# Patient Record
Sex: Female | Born: 1995 | Race: White | Hispanic: No | Marital: Married | Smoking: Former smoker
Health system: Southern US, Community
[De-identification: ages and names within clinical notes are randomized; demographics above are authoritative.]

## PROBLEM LIST (undated history)

## (undated) ENCOUNTER — Emergency Department (HOSPITAL_COMMUNITY): Admission: EM | Source: Home / Self Care

## (undated) ENCOUNTER — Inpatient Hospital Stay (HOSPITAL_COMMUNITY): Payer: Self-pay

## (undated) DIAGNOSIS — N200 Calculus of kidney: Secondary | ICD-10-CM

## (undated) DIAGNOSIS — R011 Cardiac murmur, unspecified: Secondary | ICD-10-CM

## (undated) DIAGNOSIS — I370 Nonrheumatic pulmonary valve stenosis: Secondary | ICD-10-CM

## (undated) DIAGNOSIS — K219 Gastro-esophageal reflux disease without esophagitis: Secondary | ICD-10-CM

## (undated) DIAGNOSIS — R1110 Vomiting, unspecified: Secondary | ICD-10-CM

## (undated) HISTORY — PX: WISDOM TOOTH EXTRACTION: SHX21

## (undated) HISTORY — PX: NO PAST SURGERIES: SHX2092

---
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
## 2016-01-18 ENCOUNTER — Emergency Department (HOSPITAL_COMMUNITY)
Admission: EM | Admit: 2016-01-18 | Discharge: 2016-01-18 | Disposition: A | Discharge status: Home / Self Care | Payer: Medicaid Other | Attending: Physician Assistant | Admitting: Physician Assistant

## 2016-01-18 ENCOUNTER — Encounter (HOSPITAL_COMMUNITY): Payer: Self-pay | Admitting: Emergency Medicine

## 2016-01-18 ENCOUNTER — Emergency Department (HOSPITAL_COMMUNITY): Payer: Medicaid Other

## 2016-01-18 DIAGNOSIS — R930 Abnormal findings on diagnostic imaging of skull and head, not elsewhere classified: Secondary | ICD-10-CM | POA: Insufficient documentation

## 2016-01-18 DIAGNOSIS — R569 Unspecified convulsions: Secondary | ICD-10-CM

## 2016-01-18 DIAGNOSIS — Z79899 Other long term (current) drug therapy: Secondary | ICD-10-CM | POA: Diagnosis not present

## 2016-01-18 DIAGNOSIS — R5383 Other fatigue: Secondary | ICD-10-CM | POA: Insufficient documentation

## 2016-01-18 DIAGNOSIS — R112 Nausea with vomiting, unspecified: Secondary | ICD-10-CM | POA: Diagnosis present

## 2016-01-18 LAB — I-STAT BETA HCG BLOOD, ED (MC, WL, AP ONLY)

## 2016-01-18 LAB — BASIC METABOLIC PANEL
ANION GAP: 11 (ref 5–15)
BUN: 12 mg/dL (ref 6–20)
CALCIUM: 9.3 mg/dL (ref 8.9–10.3)
CO2: 19 mmol/L — AB (ref 22–32)
CREATININE: 0.57 mg/dL (ref 0.44–1.00)
Chloride: 108 mmol/L (ref 101–111)
GLUCOSE: 125 mg/dL — AB (ref 65–99)
Potassium: 3.1 mmol/L — ABNORMAL LOW (ref 3.5–5.1)
Sodium: 138 mmol/L (ref 135–145)

## 2016-01-18 LAB — ETHANOL: Alcohol, Ethyl (B): <5 mg/dL (ref <5)

## 2016-01-18 LAB — CBC
HCT: 42 % (ref 36.0–46.0)
HEMOGLOBIN: 14.8 g/dL (ref 12.0–15.0)
MCH: 32.1 pg (ref 26.0–34.0)
MCHC: 35.2 g/dL (ref 30.0–36.0)
MCV: 91.1 fL (ref 78.0–100.0)
PLATELETS: 216 10*3/uL (ref 150–400)
RBC: 4.61 MIL/uL (ref 3.87–5.11)
RDW: 12.1 % (ref 11.5–15.5)
WBC: 13.3 10*3/uL — ABNORMAL HIGH (ref 4.0–10.5)

## 2016-01-18 LAB — CBG MONITORING, ED: GLUCOSE-CAPILLARY: 135 mg/dL — AB (ref 65–99)

## 2016-01-18 LAB — LIPASE, BLOOD: Lipase: 23 U/L (ref 11–51)

## 2016-01-18 MED ORDER — SODIUM CHLORIDE 0.9 % IV BOLUS (SEPSIS)
1000.0000 mL | Freq: Once | INTRAVENOUS | Status: AC
  Administered 2016-01-18: 1000 mL via INTRAVENOUS

## 2016-01-18 MED ORDER — DIPHENHYDRAMINE HCL 50 MG/ML IJ SOLN
25.0000 mg | Freq: Once | INTRAMUSCULAR | Status: AC
  Administered 2016-01-18: 25 mg via INTRAVENOUS
  Filled 2016-01-18: qty 1

## 2016-01-18 MED ORDER — PROCHLORPERAZINE EDISYLATE 5 MG/ML IJ SOLN
10.0000 mg | Freq: Once | INTRAMUSCULAR | Status: AC
  Administered 2016-01-18: 10 mg via INTRAVENOUS
  Filled 2016-01-18: qty 2

## 2016-01-18 NOTE — ED Notes (Signed)
Pt has been reminded about a urine sample but is still unable to obtain one at this time

## 2016-01-18 NOTE — ED Notes (Signed)
Pt is aware that a urine sample is needed but is unable to provide one at this time 

## 2016-01-18 NOTE — Discharge Instructions (Signed)
We are unsure what is causing your problem this morning. It may have been a seizure. We will need to refrain from driving or bathing a small child until you get followed up with a primary care physician and her neurologist.

## 2016-01-18 NOTE — ED Triage Notes (Addendum)
Presents via EMS, pt with possible seizure activity. States she has been under a lot of stress. Family witnessed pt slide down the wall and began shaking. Pt has hx of anxiety and takes no seizure meds. Pt given 4mg  zofran PTA due to dry heaving.

## 2016-01-18 NOTE — ED Notes (Signed)
Pt ambulates in the hall and goes to the bathroom.  Refuses to eat anything, including a saltine cracker

## 2016-01-18 NOTE — ED Notes (Signed)
Pt reports N/V since 6 this morning. Pt states she remembers falling in the hallway at home but doesn't know why. No incontinence or oral trauma noted.

## 2016-01-18 NOTE — ED Notes (Signed)
Pt can ambulate with no assistance

## 2016-01-18 NOTE — ED Notes (Signed)
RN is pulling blood off pts IV

## 2016-01-18 NOTE — ED Provider Notes (Signed)
[facility]-EMERGENCY DEPT Provider Note   CSN: [id number] Arrival date & time: 01/18/16  [id number]     History   Chief Complaint Chief Complaint  Patient presents with  . Seizures    HPI Mary Ware is a 20 y.o. female.  HPI   Patient is a 20 year old female presenting with nausea and vomiting. Patient has been seen multiple times here for the past for the same thing. Patient does not want to talk to this M.D. right now. She reports "I just need to sleep". Patient is very fatigued here. I tried to explore why it is that she has not been sleeping. . She denies any drug use. Denies pregnancy.  Patient denies any pain. She reports "nothing wrong with my stomach, I just have to sleep."  Past Medical History:  Diagnosis Date  . GERD (gastroesophageal reflux disease)   . Heart murmur    h/o r/t pulmonary valve stenosis  . Intractable vomiting   . Kidney stones   . Pulmonary valve stenosis    AS A CHILD-PT HAS OUTGROWN AND REVERSED ITSELF PER PT AND DOES NOT SEE A CARDIOLOGIST-ASYMPTOMATIC    Patient Active Problem List   Diagnosis Date Noted  . Acute pancreatitis 06/02/2015  . Nausea & vomiting 06/02/2015  . Pancreatitis 06/02/2015  . Pancreatitis, acute 06/02/2015  . Infectious gastroenteritis 06/02/2015  . Cholecystitis 04/27/2015  . Active labor 07/26/2013  . Status post vacuum-assisted vaginal delivery 07/26/2013    Past Surgical History:  Procedure Laterality Date  . CHOLECYSTECTOMY N/A 04/27/2015   Procedure: LAPAROSCOPIC CHOLECYSTECTOMY WITH INTRAOPERATIVE CHOLANGIOGRAM;  Surgeon: Nadeen LandauJarvis Wilton Smith, MD;  Location: [facility] ORS;  Service: General;  Laterality: N/A;  . NO PAST SURGERIES      OB History    Gravida Para Term Preterm AB Living   1 1 1     1    SAB TAB Ectopic Multiple Live Births           1       Home Medications    Prior to Admission medications   Medication Sig Start Date End Date Taking? Authorizing Provider  etonogestrel (NEXPLANON) 68 MG  IMPL implant 1 each by Subdermal route once. 09/2012    Historical Provider, MD  LORazepam (ATIVAN) 1 MG tablet Take 1 tablet (1 mg total) by mouth every 8 (eight) hours as needed for anxiety (Or nausea). 06/16/15 06/15/16  Minna AntisKevin Paduchowski, MD  ondansetron (ZOFRAN) 4 MG tablet Take 1 tablet (4 mg total) by mouth every 6 (six) hours as needed for nausea. 06/03/15   Belkys A Regalado, MD  promethazine (PHENERGAN) 25 MG suppository Place 1 suppository (25 mg total) rectally every 6 (six) hours as needed for nausea. 06/13/15 06/12/16  Phineas SemenGraydon Goodman, MD    Family History Family History  Problem Relation Age of Onset  . Cancer Mother     Social History Social History  Substance Use Topics  . Smoking status: Never Smoker  . Smokeless tobacco: Never Used  . Alcohol use No     Allergies   Coconut fatty acids   Review of Systems Review of Systems  Constitutional: Positive for fatigue. Negative for fever.  Gastrointestinal: Positive for nausea and vomiting. Negative for abdominal pain and diarrhea.  Neurological: Positive for weakness. Negative for dizziness.  All other systems reviewed and are negative.    Physical Exam Updated Vital Signs BP 122/60 (BP Location: Right Arm)   Pulse (!) 54   Temp 97.9 F (36.6 C) (Oral)  Resp 15   Ht 5' (1.524 m)   Wt 100 lb (45.4 kg)   LMP 01/01/2016   SpO2 99%   BMI 19.53 kg/m   Physical Exam  Constitutional: She is oriented to person, place, and time. She appears well-developed and well-nourished.  HENT:  Head: Normocephalic and atraumatic.  Eyes: Right eye exhibits no discharge.  Cardiovascular: Normal rate, regular rhythm and normal heart sounds.   No murmur heard. Pulmonary/Chest: Effort normal and breath sounds normal. She has no wheezes. She has no rales.  Abdominal: Soft. She exhibits no distension and no mass. There is no tenderness. There is no rebound and no guarding.  Neurological: She is oriented to person, place, and time. No  cranial nerve deficit.  Appears very fatigued  Skin: Skin is warm and dry. She is not diaphoretic.  Psychiatric: She has a normal mood and affect.  Patient refusing to communicate right now. Stating " I just need to sleep".  Nursing note and vitals reviewed.    ED Treatments / Results  Labs (all labs ordered are listed, but only abnormal results are displayed) Labs Reviewed  BASIC METABOLIC PANEL - Abnormal; Notable for the following:       Result Value   Potassium 3.1 (*)    CO2 19 (*)    Glucose, Bld 125 (*)    All other components within normal limits  CBC - Abnormal; Notable for the following:    WBC 13.3 (*)    All other components within normal limits  CBG MONITORING, ED - Abnormal; Notable for the following:    Glucose-Capillary 135 (*)    All other components within normal limits  RAPID URINE DRUG SCREEN, HOSP PERFORMED  ETHANOL  LIPASE, BLOOD  I-STAT BETA HCG BLOOD, ED ([facility], [facility], [facility] ONLY)    EKG  EKG Interpretation None       Radiology No results found.  Procedures Procedures (including critical care time)  Medications Ordered in ED Medications  sodium chloride 0.9 % bolus 1,000 mL (not administered)  prochlorperazine (COMPAZINE) injection 10 mg (not administered)  diphenhydrAMINE (BENADRYL) injection 25 mg (not administered)  sodium chloride 0.9 % bolus 1,000 mL (not administered)     Initial Impression / Assessment and Plan / ED Course  I have reviewed the triage vital signs and the nursing notes.  Pertinent labs & imaging results that were available during my care of the patient were reviewed by me and considered in my medical decision making (see chart for details).  Clinical Course     Patient is a 20 year old female presenting with unclear history. Patient's been seen multiple times the emergency department for nausea and vomiting. However today she had some kind of event that was witnessed by family. Family not present here/ Patient denies  any pain or issues right now she states " I just want to sleep" . However I'm concerned could've had a brain injury will rule out with the CT brain. We'll make sure the patient's labs are normal. Will check for drugs of abuse. Unsure what is driving patient's fatigue and nausea or vomiting. Will help her combat both the fatigue by helping her sleep with an antinausea medication that'll help her sleep.  4:03 PM Patient ambulatory, taking PO. I offered to talk to her parents, she refuses.   Warned patient not to drive, to follow up with PCP this week.    Final Clinical Impressions(s) / ED Diagnoses   Final diagnoses:  None    New  Prescriptions New Prescriptions   No medications on file     Wylder Macomber Randall An, MD 01/18/16 [phone]

## 2016-01-18 NOTE — ED Notes (Signed)
Pt is drinking sprite. Offered pt every item of food we have and she refuses.  Brought her graham crackers anyway and asked her to eat them.  Pt states "I cant eat right now, I will have my mom get me some food"

## 2016-01-18 NOTE — ED Notes (Signed)
Bed: [id number] Expected date: 01/18/16 Expected time:  Means of arrival:  Comments: Ems 20 yo seizure

## 2016-07-10 ENCOUNTER — Emergency Department (HOSPITAL_COMMUNITY)
Admission: EM | Admit: 2016-07-10 | Discharge: 2016-07-10 | Disposition: A | Discharge status: Home / Self Care | Payer: Medicaid Other | Attending: Emergency Medicine | Admitting: Emergency Medicine

## 2016-07-10 ENCOUNTER — Encounter (HOSPITAL_COMMUNITY): Payer: Self-pay | Admitting: Adult Health

## 2016-07-10 DIAGNOSIS — R443 Hallucinations, unspecified: Secondary | ICD-10-CM | POA: Insufficient documentation

## 2016-07-10 DIAGNOSIS — F410 Panic disorder [episodic paroxysmal anxiety] without agoraphobia: Secondary | ICD-10-CM

## 2016-07-10 NOTE — ED Notes (Signed)
Pt left without paperwork. She was pacing room and anxious to leave. She stated she had a ride with her husband who was coming to get her.
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
## 2016-07-10 NOTE — ED Notes (Signed)
PT denies hallucinations and states her sister made her come. She is requesting to leave.
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
## 2016-07-10 NOTE — Discharge Instructions (Signed)
Return here, if you need additional help or have other concerns.  Consider seeing a counselor or therapist, for help if you are nervous or uncomfortable with things.

## 2016-07-12 ENCOUNTER — Encounter (HOSPITAL_COMMUNITY): Payer: Self-pay | Admitting: Emergency Medicine

## 2016-07-12 ENCOUNTER — Inpatient Hospital Stay (HOSPITAL_COMMUNITY)
Admission: EM | Admit: 2016-07-12 | Discharge: 2016-07-13 | DRG: 885 | Disposition: A | Discharge status: Home / Self Care | Payer: Medicaid Other | Attending: Internal Medicine | Admitting: Internal Medicine

## 2016-07-12 DIAGNOSIS — F4310 Post-traumatic stress disorder, unspecified: Secondary | ICD-10-CM | POA: Diagnosis not present

## 2016-07-12 DIAGNOSIS — E876 Hypokalemia: Secondary | ICD-10-CM

## 2016-07-12 DIAGNOSIS — F13931 Sedative, hypnotic or anxiolytic use, unspecified with withdrawal delirium: Secondary | ICD-10-CM

## 2016-07-12 DIAGNOSIS — Z62810 Personal history of physical and sexual abuse in childhood: Secondary | ICD-10-CM | POA: Diagnosis not present

## 2016-07-12 DIAGNOSIS — Z91048 Other nonmedicinal substance allergy status: Secondary | ICD-10-CM | POA: Diagnosis not present

## 2016-07-12 DIAGNOSIS — F13239 Sedative, hypnotic or anxiolytic dependence with withdrawal, unspecified: Secondary | ICD-10-CM | POA: Diagnosis present

## 2016-07-12 DIAGNOSIS — F1390 Sedative, hypnotic, or anxiolytic use, unspecified, uncomplicated: Secondary | ICD-10-CM | POA: Diagnosis not present

## 2016-07-12 DIAGNOSIS — Z91018 Allergy to other foods: Secondary | ICD-10-CM | POA: Diagnosis not present

## 2016-07-12 DIAGNOSIS — R443 Hallucinations, unspecified: Secondary | ICD-10-CM | POA: Diagnosis not present

## 2016-07-12 DIAGNOSIS — Z818 Family history of other mental and behavioral disorders: Secondary | ICD-10-CM

## 2016-07-12 DIAGNOSIS — R112 Nausea with vomiting, unspecified: Secondary | ICD-10-CM | POA: Diagnosis not present

## 2016-07-12 DIAGNOSIS — Z9049 Acquired absence of other specified parts of digestive tract: Secondary | ICD-10-CM | POA: Diagnosis not present

## 2016-07-12 DIAGNOSIS — F29 Unspecified psychosis not due to a substance or known physiological condition: Secondary | ICD-10-CM | POA: Diagnosis not present

## 2016-07-12 DIAGNOSIS — F17210 Nicotine dependence, cigarettes, uncomplicated: Secondary | ICD-10-CM | POA: Diagnosis present

## 2016-07-12 DIAGNOSIS — Z793 Long term (current) use of hormonal contraceptives: Secondary | ICD-10-CM | POA: Diagnosis not present

## 2016-07-12 DIAGNOSIS — F22 Delusional disorders: Principal | ICD-10-CM | POA: Diagnosis present

## 2016-07-12 DIAGNOSIS — Z79899 Other long term (current) drug therapy: Secondary | ICD-10-CM | POA: Diagnosis not present

## 2016-07-12 DIAGNOSIS — F13231 Sedative, hypnotic or anxiolytic dependence with withdrawal delirium: Secondary | ICD-10-CM

## 2016-07-12 DIAGNOSIS — F1190 Opioid use, unspecified, uncomplicated: Secondary | ICD-10-CM | POA: Diagnosis not present

## 2016-07-12 DIAGNOSIS — Z8659 Personal history of other mental and behavioral disorders: Secondary | ICD-10-CM | POA: Diagnosis present

## 2016-07-12 DIAGNOSIS — R441 Visual hallucinations: Secondary | ICD-10-CM

## 2016-07-12 LAB — ACETAMINOPHEN LEVEL

## 2016-07-12 LAB — CBC WITH DIFFERENTIAL/PLATELET
Basophils Absolute: 0 10*3/uL (ref 0.0–0.1)
Basophils Relative: 0 %
Eosinophils Absolute: 0.1 10*3/uL (ref 0.0–0.7)
Eosinophils Relative: 1 %
HEMATOCRIT: 44.5 % (ref 36.0–46.0)
Hemoglobin: 15.6 g/dL — ABNORMAL HIGH (ref 12.0–15.0)
Lymphocytes Relative: 18 %
Lymphs Abs: 1.8 10*3/uL (ref 0.7–4.0)
MCH: 32 pg (ref 26.0–34.0)
MCHC: 35.1 g/dL (ref 30.0–36.0)
MCV: 91.2 fL (ref 78.0–100.0)
MONO ABS: 1.1 10*3/uL — AB (ref 0.1–1.0)
Monocytes Relative: 11 %
NEUTROS ABS: 7.1 10*3/uL (ref 1.7–7.7)
Neutrophils Relative %: 70 %
Platelets: 216 10*3/uL (ref 150–400)
RBC: 4.88 MIL/uL (ref 3.87–5.11)
RDW: 11.9 % (ref 11.5–15.5)
WBC: 10.2 10*3/uL (ref 4.0–10.5)

## 2016-07-12 LAB — URINALYSIS, ROUTINE W REFLEX MICROSCOPIC
BILIRUBIN URINE: NEGATIVE
Glucose, UA: NEGATIVE mg/dL
KETONES UR: 20 mg/dL — AB
LEUKOCYTES UA: NEGATIVE
NITRITE: NEGATIVE
PROTEIN: 30 mg/dL — AB
Specific Gravity, Urine: 1.027 (ref 1.005–1.030)
pH: 5 (ref 5.0–8.0)

## 2016-07-12 LAB — COMPREHENSIVE METABOLIC PANEL
ALBUMIN: 4.5 g/dL (ref 3.5–5.0)
ALT: 14 U/L (ref 14–54)
ANION GAP: 13 (ref 5–15)
AST: 27 U/L (ref 15–41)
Alkaline Phosphatase: 44 U/L (ref 38–126)
BILIRUBIN TOTAL: 1.8 mg/dL — AB (ref 0.3–1.2)
BUN: 13 mg/dL (ref 6–20)
CO2: 20 mmol/L — ABNORMAL LOW (ref 22–32)
Calcium: 9.2 mg/dL (ref 8.9–10.3)
Chloride: 105 mmol/L (ref 101–111)
Creatinine, Ser: 0.82 mg/dL (ref 0.44–1.00)
GFR calc Af Amer: >60 mL/min (ref >60)
GFR calc non Af Amer: >60 mL/min (ref >60)
GLUCOSE: 110 mg/dL — AB (ref 65–99)
POTASSIUM: 2.8 mmol/L — AB (ref 3.5–5.1)
Sodium: 138 mmol/L (ref 135–145)
TOTAL PROTEIN: 6.8 g/dL (ref 6.5–8.1)

## 2016-07-12 LAB — SALICYLATE LEVEL

## 2016-07-12 LAB — LIPASE, BLOOD: Lipase: 18 U/L (ref 11–51)

## 2016-07-12 LAB — I-STAT BETA HCG BLOOD, ED (MC, WL, AP ONLY): I-stat hCG, quantitative: <5 m[IU]/mL (ref <5)

## 2016-07-12 LAB — RAPID URINE DRUG SCREEN, HOSP PERFORMED
Amphetamines: NOT DETECTED
Barbiturates: NOT DETECTED
Benzodiazepines: POSITIVE — AB
Cocaine: NOT DETECTED
OPIATES: NOT DETECTED
Tetrahydrocannabinol: POSITIVE — AB

## 2016-07-12 LAB — ETHANOL

## 2016-07-12 MED ORDER — ONDANSETRON HCL 4 MG/2ML IJ SOLN: 4.0000 mg | Freq: Four times a day (QID) | INTRAMUSCULAR | Status: DC | PRN

## 2016-07-12 MED ORDER — POTASSIUM CHLORIDE 10 MEQ/100ML IV SOLN
10.0000 meq | Freq: Once | INTRAVENOUS | Status: AC
  Administered 2016-07-12: 10 meq via INTRAVENOUS
  Filled 2016-07-12: qty 100

## 2016-07-12 MED ORDER — LORAZEPAM 2 MG/ML IJ SOLN
2.0000 mg | Freq: Once | INTRAMUSCULAR | Status: AC
  Administered 2016-07-12: 2 mg via INTRAMUSCULAR
  Filled 2016-07-12: qty 1

## 2016-07-12 MED ORDER — POLYETHYLENE GLYCOL 3350 17 G PO PACK: 17.0000 g | PACK | Freq: Every day | ORAL | Status: DC | PRN

## 2016-07-12 MED ORDER — ONDANSETRON HCL 4 MG PO TABS: 4.0000 mg | ORAL_TABLET | Freq: Four times a day (QID) | ORAL | Status: DC | PRN

## 2016-07-12 MED ORDER — SODIUM CHLORIDE 0.9 % IV BOLUS (SEPSIS)
1000.0000 mL | Freq: Once | INTRAVENOUS | Status: AC
  Administered 2016-07-12: 1000 mL via INTRAVENOUS

## 2016-07-12 MED ORDER — HALOPERIDOL LACTATE 5 MG/ML IJ SOLN
5.0000 mg | Freq: Once | INTRAMUSCULAR | Status: AC
  Administered 2016-07-12: 5 mg via INTRAMUSCULAR
  Filled 2016-07-12: qty 1

## 2016-07-12 MED ORDER — THIAMINE HCL 100 MG/ML IJ SOLN
Freq: Once | INTRAVENOUS | Status: AC
  Administered 2016-07-12: 11:00:00 via INTRAVENOUS
  Filled 2016-07-12: qty 1000

## 2016-07-12 MED ORDER — ENOXAPARIN SODIUM 40 MG/0.4ML ~~LOC~~ SOLN
40.0000 mg | SUBCUTANEOUS | Status: DC
  Filled 2016-07-12: qty 0.4

## 2016-07-12 MED ORDER — LORAZEPAM 2 MG/ML IJ SOLN
2.0000 mg | Freq: Once | INTRAMUSCULAR | Status: AC
Start: 2016-07-12 — End: 2016-07-12
  Administered 2016-07-12: 2 mg via INTRAMUSCULAR
  Filled 2016-07-12: qty 1

## 2016-07-12 MED ORDER — POTASSIUM CHLORIDE CRYS ER 20 MEQ PO TBCR
40.0000 meq | EXTENDED_RELEASE_TABLET | Freq: Once | ORAL | Status: DC
  Filled 2016-07-12: qty 2

## 2016-07-12 MED ORDER — ACETAMINOPHEN 650 MG RE SUPP: 650.0000 mg | Freq: Four times a day (QID) | RECTAL | Status: DC | PRN

## 2016-07-12 MED ORDER — ACETAMINOPHEN 325 MG PO TABS: 650.0000 mg | ORAL_TABLET | Freq: Four times a day (QID) | ORAL | Status: DC | PRN

## 2016-07-12 MED ORDER — ENSURE ENLIVE PO LIQD
237.0000 mL | Freq: Two times a day (BID) | ORAL | Status: DC
  Administered 2016-07-13: 237 mL via ORAL

## 2016-07-12 MED ORDER — POTASSIUM CHLORIDE 10 MEQ/100ML IV SOLN
10.0000 meq | INTRAVENOUS | Status: AC
  Administered 2016-07-12 (×6): 10 meq via INTRAVENOUS
  Filled 2016-07-12 (×6): qty 100

## 2016-07-12 NOTE — ED Notes (Addendum)
Admitting Providers at bedside. 

## 2016-07-12 NOTE — ED Notes (Signed)
Pt continues to be restless in room, moving from bed to floor and then to chair. Family concerned regarding pt safety. RN and MD at bedside, pt continues to be cooperative with medication administration from this RN. Pt resting in bed after medication administration.

## 2016-07-12 NOTE — ED Notes (Signed)
Pt restless in bed. Pt cooperative and calm with medication administration at this time.
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
## 2016-07-12 NOTE — ED Notes (Signed)
[employer] arrived with 3 copies of IVC paperwork for pt.
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
## 2016-07-12 NOTE — ED Notes (Signed)
Family member notes pt has no mental health dx but has had hallucinations for some time now. Possible misuse of xanax.
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
## 2016-07-13 DIAGNOSIS — F1390 Sedative, hypnotic, or anxiolytic use, unspecified, uncomplicated: Secondary | ICD-10-CM

## 2016-07-13 DIAGNOSIS — F1190 Opioid use, unspecified, uncomplicated: Secondary | ICD-10-CM

## 2016-07-13 DIAGNOSIS — R112 Nausea with vomiting, unspecified: Secondary | ICD-10-CM

## 2016-07-13 DIAGNOSIS — E876 Hypokalemia: Secondary | ICD-10-CM

## 2016-07-13 DIAGNOSIS — F4310 Post-traumatic stress disorder, unspecified: Secondary | ICD-10-CM

## 2016-07-13 DIAGNOSIS — R443 Hallucinations, unspecified: Secondary | ICD-10-CM

## 2016-07-13 DIAGNOSIS — F29 Unspecified psychosis not due to a substance or known physiological condition: Secondary | ICD-10-CM

## 2016-07-13 LAB — CBC
HEMATOCRIT: 38.7 % (ref 36.0–46.0)
Hemoglobin: 12.8 g/dL (ref 12.0–15.0)
MCH: 30.5 pg (ref 26.0–34.0)
MCHC: 33.1 g/dL (ref 30.0–36.0)
MCV: 92.4 fL (ref 78.0–100.0)
Platelets: 170 10*3/uL (ref 150–400)
RBC: 4.19 MIL/uL (ref 3.87–5.11)
RDW: 11.6 % (ref 11.5–15.5)
WBC: 7 10*3/uL (ref 4.0–10.5)

## 2016-07-13 LAB — BASIC METABOLIC PANEL
Anion gap: 6 (ref 5–15)
BUN: 8 mg/dL (ref 6–20)
CHLORIDE: 110 mmol/L (ref 101–111)
CO2: 21 mmol/L — AB (ref 22–32)
Calcium: 8.2 mg/dL — ABNORMAL LOW (ref 8.9–10.3)
Creatinine, Ser: 0.66 mg/dL (ref 0.44–1.00)
GFR calc Af Amer: >60 mL/min (ref >60)
GFR calc non Af Amer: >60 mL/min (ref >60)
Glucose, Bld: 76 mg/dL (ref 65–99)
POTASSIUM: 3.6 mmol/L (ref 3.5–5.1)
SODIUM: 137 mmol/L (ref 135–145)

## 2016-07-13 LAB — HIV ANTIBODY (ROUTINE TESTING W REFLEX): HIV Screen 4th Generation wRfx: NONREACTIVE

## 2016-07-13 MED ORDER — MIRTAZAPINE 15 MG PO TABS: 15.0000 mg | ORAL_TABLET | Freq: Every day | ORAL | 0 refills | Status: DC

## 2016-07-13 MED ORDER — POTASSIUM CHLORIDE CRYS ER 20 MEQ PO TBCR
40.0000 meq | EXTENDED_RELEASE_TABLET | Freq: Once | ORAL | Status: AC
  Administered 2016-07-13: 40 meq via ORAL
  Filled 2016-07-13: qty 2

## 2016-07-13 NOTE — Progress Notes (Signed)
Patient was discharged to home. AVS was reviewed with patient and a copy was given to patient as well, also with a prescription and work note. Patient agreed and verbalized understanding with no further questions at this moment. Patient left the unit via wheelchair accompanied by her sitter and spouse.  Mary Ware, [name] n 07/13/16 2:31 PM
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
## 2016-08-07 ENCOUNTER — Emergency Department (HOSPITAL_COMMUNITY): Payer: Medicaid Other

## 2016-08-07 ENCOUNTER — Encounter (HOSPITAL_COMMUNITY): Payer: Self-pay

## 2016-08-07 ENCOUNTER — Emergency Department (HOSPITAL_COMMUNITY)
Admission: EM | Admit: 2016-08-07 | Discharge: 2016-08-08 | Disposition: A | Discharge status: Home / Self Care | Payer: Medicaid Other | Attending: Emergency Medicine | Admitting: Emergency Medicine

## 2016-08-07 DIAGNOSIS — F430 Acute stress reaction: Secondary | ICD-10-CM | POA: Insufficient documentation

## 2016-08-07 DIAGNOSIS — R011 Cardiac murmur, unspecified: Secondary | ICD-10-CM | POA: Insufficient documentation

## 2016-08-07 DIAGNOSIS — R4182 Altered mental status, unspecified: Secondary | ICD-10-CM | POA: Diagnosis present

## 2016-08-07 LAB — URINALYSIS, ROUTINE W REFLEX MICROSCOPIC
Glucose, UA: NEGATIVE mg/dL
Hgb urine dipstick: NEGATIVE
KETONES UR: NEGATIVE mg/dL
LEUKOCYTES UA: NEGATIVE
Nitrite: NEGATIVE
PH: 5 (ref 5.0–8.0)
Protein, ur: 30 mg/dL — AB
Specific Gravity, Urine: 1.031 — ABNORMAL HIGH (ref 1.005–1.030)

## 2016-08-07 LAB — CBC WITH DIFFERENTIAL/PLATELET
Basophils Absolute: 0 10*3/uL (ref 0.0–0.1)
Basophils Relative: 1 %
EOS ABS: 0.2 10*3/uL (ref 0.0–0.7)
EOS PCT: 3 %
HCT: 41.6 % (ref 36.0–46.0)
Hemoglobin: 14.6 g/dL (ref 12.0–15.0)
LYMPHS ABS: 2.3 10*3/uL (ref 0.7–4.0)
Lymphocytes Relative: 27 %
MCH: 32.3 pg (ref 26.0–34.0)
MCHC: 35.1 g/dL (ref 30.0–36.0)
MCV: 92 fL (ref 78.0–100.0)
MONOS PCT: 7 %
Monocytes Absolute: 0.6 10*3/uL (ref 0.1–1.0)
Neutro Abs: 5.4 10*3/uL (ref 1.7–7.7)
Neutrophils Relative %: 64 %
PLATELETS: 186 10*3/uL (ref 150–400)
RBC: 4.52 MIL/uL (ref 3.87–5.11)
RDW: 11.6 % (ref 11.5–15.5)
WBC: 8.5 10*3/uL (ref 4.0–10.5)

## 2016-08-07 LAB — I-STAT BETA HCG BLOOD, ED (MC, WL, AP ONLY): I-stat hCG, quantitative: <5 m[IU]/mL (ref <5)

## 2016-08-07 LAB — COMPREHENSIVE METABOLIC PANEL
ALT: 10 U/L — ABNORMAL LOW (ref 14–54)
ANION GAP: 9 (ref 5–15)
AST: 24 U/L (ref 15–41)
Albumin: 4.2 g/dL (ref 3.5–5.0)
Alkaline Phosphatase: 41 U/L (ref 38–126)
BUN: 10 mg/dL (ref 6–20)
CHLORIDE: 108 mmol/L (ref 101–111)
CO2: 23 mmol/L (ref 22–32)
Calcium: 9.2 mg/dL (ref 8.9–10.3)
Creatinine, Ser: 0.85 mg/dL (ref 0.44–1.00)
GFR calc non Af Amer: >60 mL/min (ref >60)
Glucose, Bld: 95 mg/dL (ref 65–99)
Potassium: 3 mmol/L — ABNORMAL LOW (ref 3.5–5.1)
SODIUM: 140 mmol/L (ref 135–145)
Total Bilirubin: 1.2 mg/dL (ref 0.3–1.2)
Total Protein: 6.6 g/dL (ref 6.5–8.1)

## 2016-08-07 LAB — RAPID URINE DRUG SCREEN, HOSP PERFORMED
Amphetamines: NOT DETECTED
Barbiturates: NOT DETECTED
Benzodiazepines: POSITIVE — AB
COCAINE: NOT DETECTED
OPIATES: NOT DETECTED
Tetrahydrocannabinol: POSITIVE — AB

## 2016-08-07 LAB — SALICYLATE LEVEL: Salicylate Lvl: <7 mg/dL (ref 2.8–30.0)

## 2016-08-07 LAB — ACETAMINOPHEN LEVEL

## 2016-08-07 LAB — CBG MONITORING, ED: Glucose-Capillary: 87 mg/dL (ref 65–99)

## 2016-08-07 LAB — LIPASE, BLOOD: Lipase: 23 U/L (ref 11–51)

## 2016-08-07 LAB — ETHANOL

## 2016-08-07 MED ORDER — POTASSIUM CHLORIDE CRYS ER 20 MEQ PO TBCR
40.0000 meq | EXTENDED_RELEASE_TABLET | Freq: Once | ORAL | Status: AC
  Administered 2016-08-07: 40 meq via ORAL
  Filled 2016-08-07: qty 2

## 2016-08-07 MED ORDER — SODIUM CHLORIDE 0.9 % IV BOLUS (SEPSIS)
1000.0000 mL | Freq: Once | INTRAVENOUS | Status: AC
  Administered 2016-08-07: 1000 mL via INTRAVENOUS

## 2016-08-07 MED ORDER — POTASSIUM CHLORIDE CRYS ER 20 MEQ PO TBCR: 40.0000 meq | EXTENDED_RELEASE_TABLET | Freq: Once | ORAL | Status: DC

## 2016-08-07 NOTE — ED Notes (Signed)
Call Carmie KannerBobby Hyler (father in law) at [phone] - [facility].

## 2016-08-07 NOTE — ED Notes (Signed)
MD aware of hr and bp and does not feel fluids are needed at this time.  Pt ao x 4 and ok to go to radiology.

## 2016-08-07 NOTE — ED Notes (Signed)
Attempting to collect urine.  Pt place on bedpan.  Pt remains ao x 4.

## 2016-08-07 NOTE — ED Notes (Addendum)
Called lab.  They were unable to locate labs until now.  They are running labs now.
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
## 2016-08-07 NOTE — ED Notes (Signed)
Pt drowst

## 2016-08-07 NOTE — ED Notes (Signed)
The pt had an order  For med at 2030  Med was not given as I was envolved with another pt.  Order times out reentered by order of dr Dalene Seltzerschlossman

## 2016-08-07 NOTE — Progress Notes (Signed)
Pt placed on NRB while unresponsive, but ultimately changed to 2L [place] with etco2 detector to monitor. Pt spo2 100%, etc02 53. MD aware, RN at bedside. RT will continue to monitor.

## 2016-08-07 NOTE — ED Notes (Signed)
Patient transported to CT 

## 2016-08-07 NOTE — ED Notes (Signed)
The pt wants to go up and see her husband  Waiting to talk with the edp

## 2016-08-08 ENCOUNTER — Other Ambulatory Visit: Payer: Self-pay | Admitting: Internal Medicine

## 2016-08-08 NOTE — ED Notes (Signed)
Patient able to ambulate independently  

## 2016-10-24 IMAGING — CT CT ABD-PELV W/ CM
2 of 4 series · 15 of 46 positions shown, 17 images · IV contrast (ISOVUE)
Comparison: CT abdomen pelvis - 06/30/2014

CLINICAL DATA: Vomiting for the past 7 hours. Concern for food
poisoning.

EXAM:
CT ABDOMEN AND PELVIS WITH CONTRAST
TECHNIQUE: Multidetector CT imaging of the abdomen and pelvis was performed
using the standard protocol following bolus administration of
intravenous contrast.
CONTRAST:  80mL BCO09F-MTT IOPAMIDOL (BCO09F-MTT) INJECTION 61%

[Series 3: abd/pel with · axial · 0.70mm/px · z∈[+888,+1282]mm · 12 of 89 slices shown, 14 images]
[im 5/89  soft-tissue]
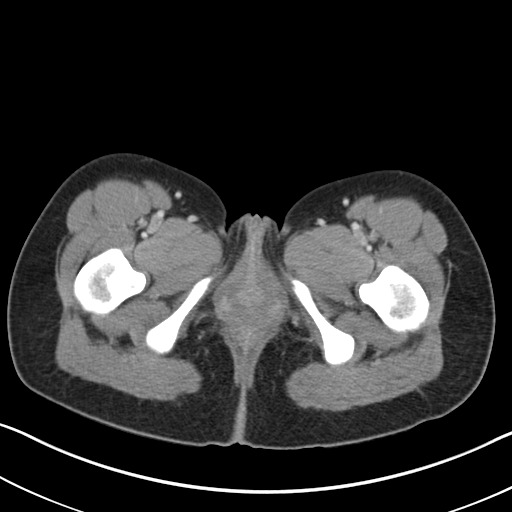
[im 5/89  bone]
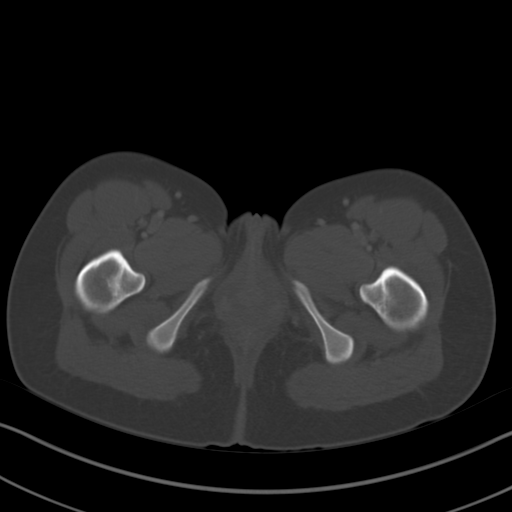
[im 13/89  soft-tissue]
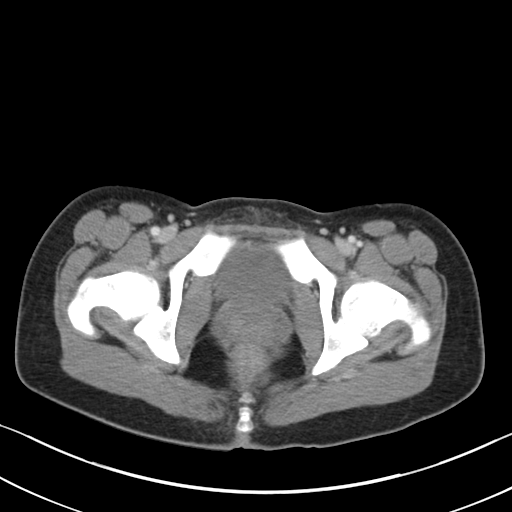
[im 21/89  soft-tissue]
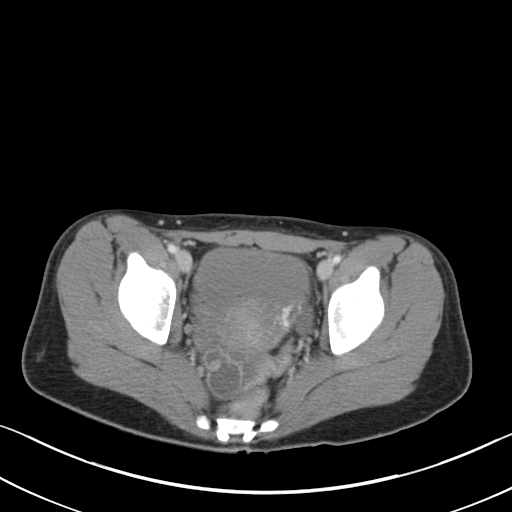
[im 26/89  soft-tissue]
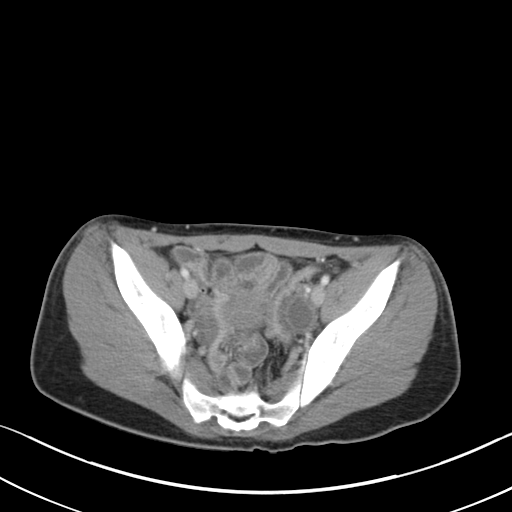
[im 34/89  soft-tissue]
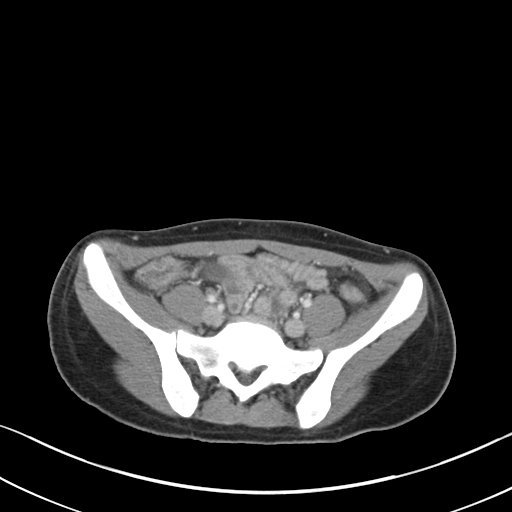
[im 42/89  soft-tissue]
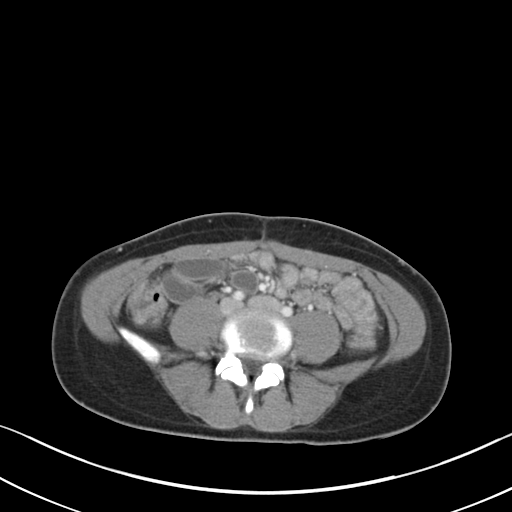
[im 47/89  soft-tissue]
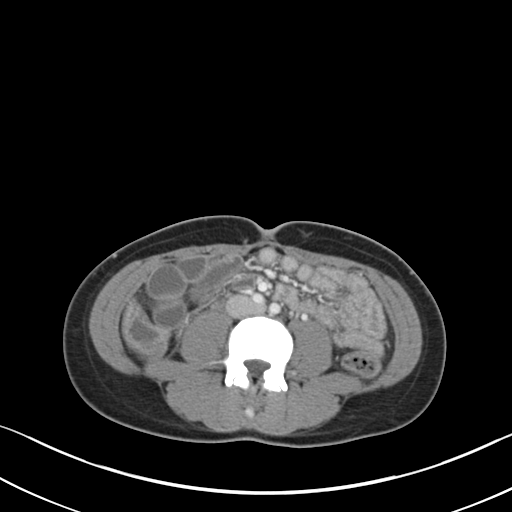
[im 55/89  soft-tissue]
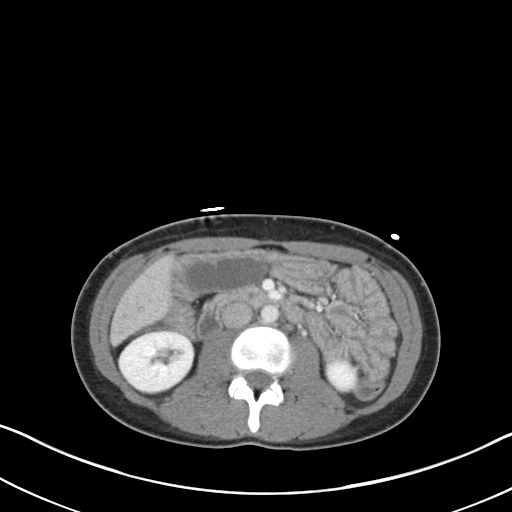
[im 63/89  soft-tissue]
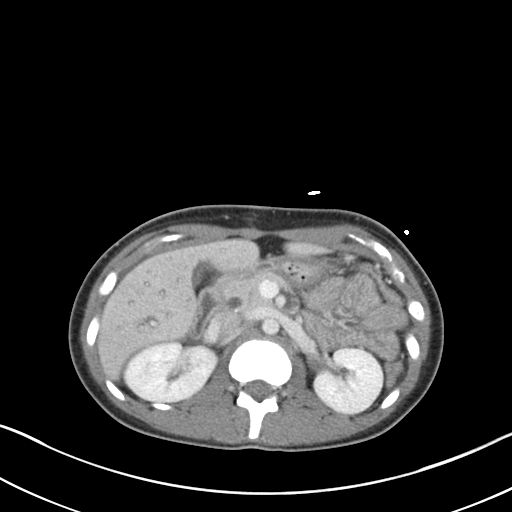
[im 63/89  bone]
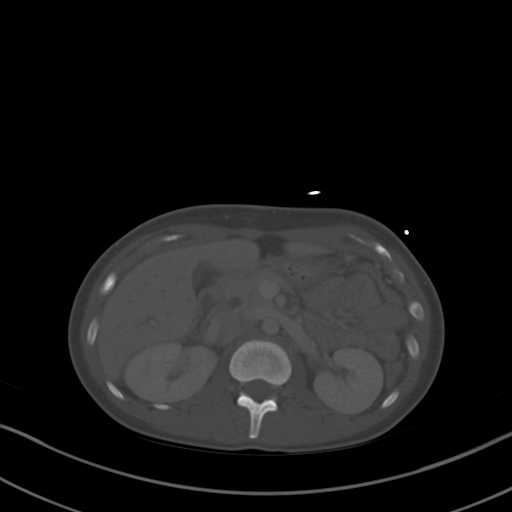
[im 68/89  soft-tissue]
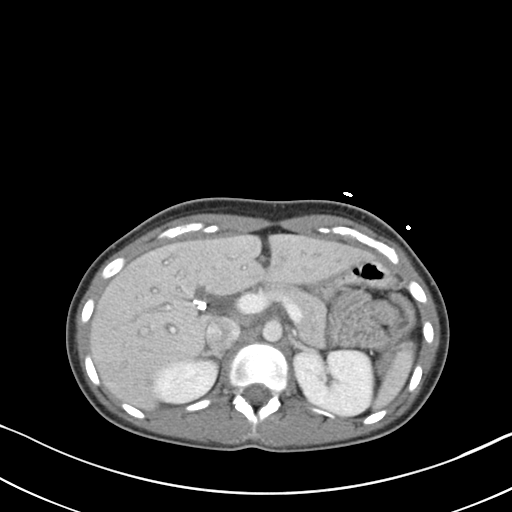
[im 76/89  soft-tissue]
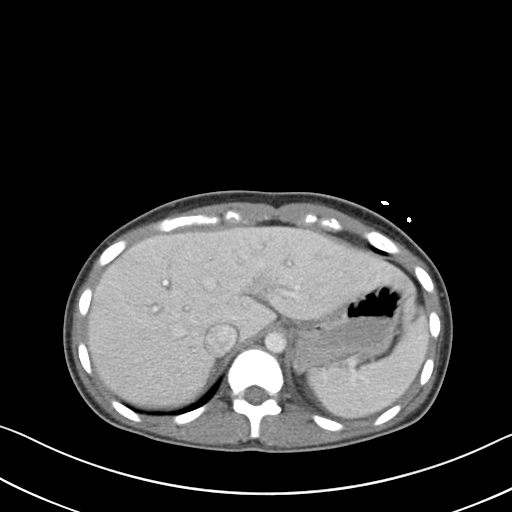
[im 84/89  soft-tissue]
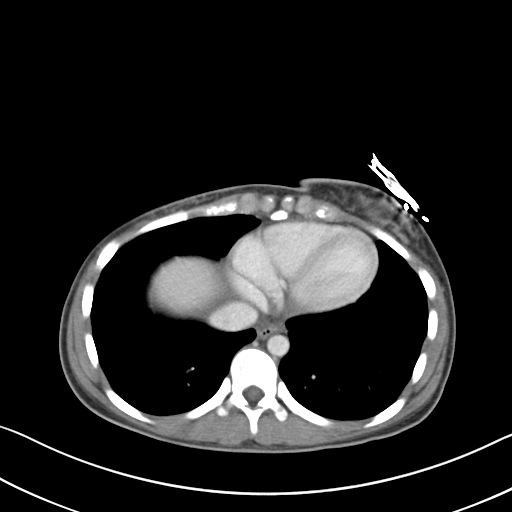

[Series 4: coronal a/|p · coronal · 0.72mm/px · 3 of 85 slices shown]
[im 29/85  soft-tissue]
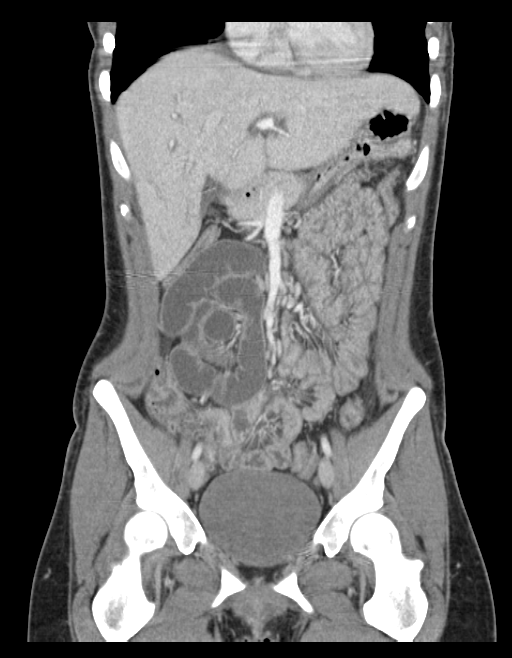
[im 38/85  soft-tissue]
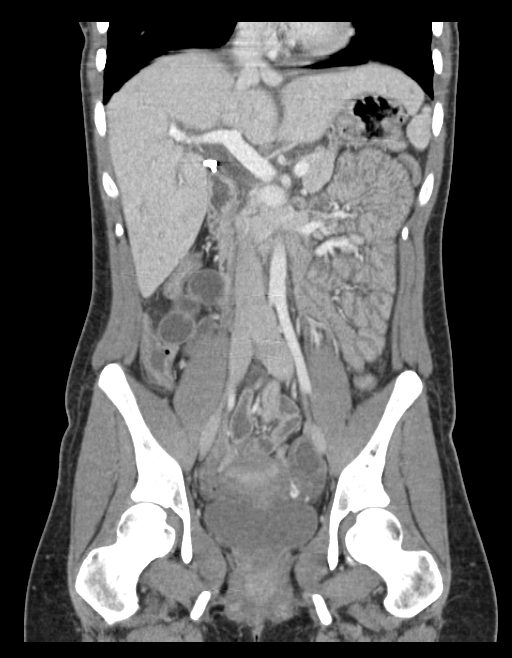
[im 47/85  soft-tissue]
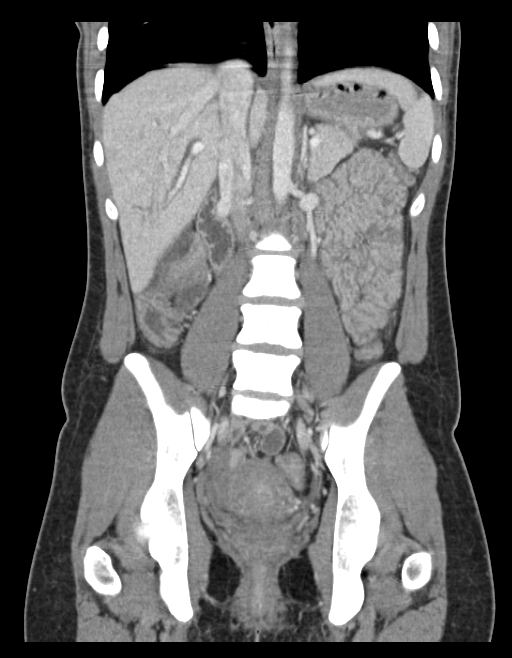

[15 of 46 positions shown; findings below may reference images not displayed]

FINDINGS: Lower chest: Limited visualization of lower thorax demonstrates
minimal dependent subpleural ground-glass atelectasis. No focal
airspace opacities. No pleural effusion.

Normal heart size.  No pericardial effusion.

Hepatobiliary: Normal hepatic contour. Post cholecystectomy. Common
bile duct is mildly dilated measuring 7 mm in diameter, likely the
sequela of post cholecystectomy state. There is a minimal amount of
periportal edema. No discrete hepatic lesions. No evidence of mid
site ease.

Pancreas: Normal appearance of the pancreas. No definitive
pancreatic duct dilatation. No peripancreatic stranding.

Spleen: Normal in appearance.

Adrenals/Urinary Tract: There is symmetric enhancement of the
bilateral kidneys. No definite renal stones on this postcontrast
examination. No discrete renal lesions. No urine obstruction or
perinephric stranding.

Normal appearance of the urinary bladder given degree distention.

Normal appearance of the bilateral adrenal glands.

Stomach/Bowel: The bowel is normal in course and caliber without
evidence of wall thickening. Normal appearance of the terminal ileum
and retrocecal appendix. No pneumoperitoneum, pneumatosis or portal
venous gas.

Vascular/Lymphatic: Normal caliber of the abdominal aorta. The major
branch vessels of the abdominal aorta are widely patent on this non
CTA examination.

No bulky retroperitoneal, mesenteric, pelvic or inguinal
lymphadenopathy.

Reproductive: Note is made of an approximately 1.7 x 2.1 cm hypo
attenuating (20 Hounsfield unit) left-sided physiologic adnexal
cysts. Otherwise, normal appearance of the pelvic organs. No
discrete right-sided adnexal lesion. There is a small amount of
presumably physiologic free fluid in the pelvic cul-de-sac (image
71, series 3).

Other: Regional soft tissues appear normal.

Musculoskeletal: No acute or aggressive osseous abnormalities.
IMPRESSION: 1. Minimal amount of periportal edema, the etiology of which is not
depicted on this examination. Correlation with LFTs is recommended.
2. Otherwise, no acute findings within the abdomen or pelvis.
3. Mild prominence of the common bile duct favored to be secondary
to post cholecystectomy state and biliary reservoir phenomenon.
4. Incidentally noted approximately 2.1 cm left-sided physiologic
adnexal cyst.

## 2017-06-10 ENCOUNTER — Encounter (HOSPITAL_COMMUNITY): Payer: Self-pay | Admitting: Emergency Medicine

## 2017-06-10 ENCOUNTER — Other Ambulatory Visit: Payer: Self-pay

## 2017-06-10 ENCOUNTER — Emergency Department (HOSPITAL_COMMUNITY)
Admission: EM | Admit: 2017-06-10 | Discharge: 2017-06-10 | Disposition: A | Discharge status: Home / Self Care | Payer: Medicaid Other | Attending: Emergency Medicine | Admitting: Emergency Medicine

## 2017-06-10 DIAGNOSIS — R112 Nausea with vomiting, unspecified: Secondary | ICD-10-CM

## 2017-06-10 DIAGNOSIS — F17210 Nicotine dependence, cigarettes, uncomplicated: Secondary | ICD-10-CM | POA: Insufficient documentation

## 2017-06-10 LAB — I-STAT CHEM 8, ED
BUN: 6 mg/dL (ref 6–20)
CREATININE: 0.6 mg/dL (ref 0.44–1.00)
Calcium, Ion: 1.17 mmol/L (ref 1.15–1.40)
Chloride: 105 mmol/L (ref 101–111)
Glucose, Bld: 71 mg/dL (ref 65–99)
HEMATOCRIT: 42 % (ref 36.0–46.0)
HEMOGLOBIN: 14.3 g/dL (ref 12.0–15.0)
POTASSIUM: 3.9 mmol/L (ref 3.5–5.1)
Sodium: 141 mmol/L (ref 135–145)
TCO2: 25 mmol/L (ref 22–32)

## 2017-06-10 MED ORDER — ONDANSETRON HCL 4 MG PO TABS: 4.0000 mg | ORAL_TABLET | Freq: Three times a day (TID) | ORAL | 0 refills | Status: DC | PRN

## 2017-06-10 MED ORDER — ONDANSETRON 4 MG PO TBDP
4.0000 mg | ORAL_TABLET | Freq: Once | ORAL | Status: AC
  Administered 2017-06-10: 4 mg via ORAL
  Filled 2017-06-10: qty 1

## 2017-06-10 NOTE — ED Provider Notes (Signed)
[facility] EMERGENCY DEPARTMENT Provider Note   CSN: [id number] Arrival date & time: 06/10/17  1515     History   Chief Complaint Chief Complaint  Patient presents with  . Abdominal Pain    HPI Mary Ware is a 22 y.o. female.  Dates "I am just here to get a notes or go back to work".  She states that her nausea and abdominal pain both are better.  She is been tolerating p.o. fluids and solids.  She tried to go back to work and they went letter because she did have a doctor's note and that so she is here.  States she still feels a little bit "crummy".  The history is provided by the patient.  Abdominal Pain   This is a recurrent problem. The current episode started 12 to 24 hours ago. The problem has been resolved. The pain is located in the suprapubic region. The quality of the pain is aching. The pain is mild.    Past Medical History:  Diagnosis Date  . GERD (gastroesophageal reflux disease)   . Heart murmur    h/o r/t pulmonary valve stenosis  . Intractable vomiting   . Kidney stones   . Pulmonary valve stenosis    AS A CHILD-PT HAS OUTGROWN AND REVERSED ITSELF PER PT AND DOES NOT SEE A CARDIOLOGIST-ASYMPTOMATIC    Patient Active Problem List   Diagnosis Date Noted  . Psychosis (HCC) 07/12/2016  . Hypokalemia 07/12/2016  . PTSD (post-traumatic stress disorder) 07/12/2016  . Visual hallucination 07/12/2016  . Acute pancreatitis 06/02/2015  . Nausea & vomiting 06/02/2015  . Pancreatitis 06/02/2015  . Pancreatitis, acute 06/02/2015  . Infectious gastroenteritis 06/02/2015  . Cholecystitis 04/27/2015  . Active labor 07/26/2013  . Status post vacuum-assisted vaginal delivery 07/26/2013    Past Surgical History:  Procedure Laterality Date  . CHOLECYSTECTOMY N/A 04/27/2015   Procedure: LAPAROSCOPIC CHOLECYSTECTOMY WITH INTRAOPERATIVE CHOLANGIOGRAM;  Surgeon: Nadeen Landau, MD;  Location: [facility] ORS;  Service: General;  Laterality: N/A;  . NO PAST  SURGERIES       OB History    Gravida  1   Para  1   Term  1   Preterm      AB      Living  1     SAB      TAB      Ectopic      Multiple      Live Births  1            Home Medications    Prior to Admission medications   Medication Sig Start Date End Date Taking? Authorizing Provider  etonogestrel (NEXPLANON) 68 MG IMPL implant 1 each by Subdermal route once. 09/2012   Yes [provider]  ondansetron (ZOFRAN) 4 MG tablet Take 1 tablet (4 mg total) by mouth every 8 (eight) hours as needed for nausea or vomiting. 06/10/17   Elizabet Schweppe, Barbara Cower, MD    Family History Family History  Problem Relation Age of Onset  . Cancer Mother     Social History Social History   Tobacco Use  . Smoking status: Current Every Day Smoker    Packs/day: 0.50    Types: Cigarettes  . Smokeless tobacco: Never Used  Substance Use Topics  . Alcohol use: No  . Drug use: Not Currently    Types: Heroin, Benzodiazepines    Comment: + UDS in 2016 for THC (tetrahydrocannabinol)     Allergies   Coconut fatty acids  Review of Systems Review of Systems  Gastrointestinal: Positive for abdominal pain.  All other systems reviewed and are negative.    Physical Exam Updated Vital Signs BP (!) 101/52 (BP Location: Right Arm)   Pulse 75   Temp 98 F (36.7 C) (Oral)   Resp 16   Ht 5' (1.524 m)   Wt 57.6 kg (127 lb)   LMP 06/02/2017   SpO2 97%   BMI 24.80 kg/m   Physical Exam  Constitutional: She is oriented to person, place, and time. She appears well-developed and well-nourished.  HENT:  Head: Normocephalic and atraumatic.  Eyes: Pupils are equal, round, and reactive to light. EOM are normal.  Neck: Normal range of motion.  Cardiovascular: Normal rate and regular rhythm.  Pulmonary/Chest: Effort normal and breath sounds normal. No stridor. No respiratory distress.  Abdominal: Soft. Normal appearance and bowel sounds are normal. She exhibits no distension. There is  no tenderness. There is no guarding.  Neurological: She is alert and oriented to person, place, and time. No cranial nerve deficit.  Skin: Skin is warm and dry.  Nursing note and vitals reviewed.    ED Treatments / Results  Labs (all labs ordered are listed, but only abnormal results are displayed) Labs Reviewed  I-STAT CHEM 8, ED    EKG None  Radiology No results found.  Procedures Procedures (including critical care time)  Medications Ordered in ED Medications  ondansetron (ZOFRAN-ODT) disintegrating tablet 4 mg (4 mg Oral Given 06/10/17 1633)    Initial Impression / Assessment and Plan / ED Course  I have reviewed the triage vital signs and the nursing notes.  Pertinent labs & imaging results that were available during my care of the patient were reviewed by me and considered in my medical decision making (see chart for details).   Benign abdomen. Normal labs. Tolerating PO solids and liquids. Work note provided.   Final Clinical Impressions(s) / ED Diagnoses   Final diagnoses:  Nausea and vomiting, intractability of vomiting not specified, unspecified vomiting type    ED Discharge Orders        Ordered    ondansetron (ZOFRAN) 4 MG tablet  Every 8 hours PRN     06/10/17 1719       Neelam Tiggs, Barbara Cower, MD 06/10/17 2218

## 2017-06-10 NOTE — ED Triage Notes (Signed)
Onset last night abdominal pain, vomiting. Last time she vomited was 8AM. Still having pain and feeling tired.

## 2018-02-20 ENCOUNTER — Emergency Department
Admission: EM | Admit: 2018-02-20 | Discharge: 2018-02-20 | Disposition: A | Discharge status: Home / Self Care | Payer: Medicaid Other | Attending: Emergency Medicine | Admitting: Emergency Medicine

## 2018-02-20 ENCOUNTER — Emergency Department: Payer: Medicaid Other

## 2018-02-20 DIAGNOSIS — R112 Nausea with vomiting, unspecified: Secondary | ICD-10-CM | POA: Insufficient documentation

## 2018-02-20 DIAGNOSIS — R079 Chest pain, unspecified: Secondary | ICD-10-CM | POA: Insufficient documentation

## 2018-02-20 DIAGNOSIS — F17210 Nicotine dependence, cigarettes, uncomplicated: Secondary | ICD-10-CM | POA: Insufficient documentation

## 2018-02-20 LAB — CBC
HCT: 43.8 % (ref 36.0–46.0)
Hemoglobin: 14.8 g/dL (ref 12.0–15.0)
MCH: 31.3 pg (ref 26.0–34.0)
MCHC: 33.8 g/dL (ref 30.0–36.0)
MCV: 92.6 fL (ref 80.0–100.0)
NRBC: 0 % (ref 0.0–0.2)
PLATELETS: 205 10*3/uL (ref 150–400)
RBC: 4.73 MIL/uL (ref 3.87–5.11)
RDW: 11.8 % (ref 11.5–15.5)
WBC: 7.9 10*3/uL (ref 4.0–10.5)

## 2018-02-20 LAB — BASIC METABOLIC PANEL
Anion gap: 6 (ref 5–15)
BUN: 9 mg/dL (ref 6–20)
CALCIUM: 8.7 mg/dL — AB (ref 8.9–10.3)
CO2: 23 mmol/L (ref 22–32)
CREATININE: 0.67 mg/dL (ref 0.44–1.00)
Chloride: 111 mmol/L (ref 98–111)
GFR calc non Af Amer: >60 mL/min (ref >60)
Glucose, Bld: 94 mg/dL (ref 70–99)
Potassium: 3.5 mmol/L (ref 3.5–5.1)
SODIUM: 140 mmol/L (ref 135–145)

## 2018-02-20 LAB — TROPONIN I

## 2018-02-20 MED ORDER — SODIUM CHLORIDE 0.9 % IV BOLUS: 1000.0000 mL | Freq: Once | INTRAVENOUS | Status: DC

## 2018-02-20 MED ORDER — SODIUM CHLORIDE 0.9% FLUSH: 3.0000 mL | Freq: Once | INTRAVENOUS | Status: DC

## 2018-02-20 MED ORDER — ONDANSETRON HCL 4 MG/2ML IJ SOLN: 4.0000 mg | Freq: Once | INTRAMUSCULAR | Status: DC

## 2018-02-20 MED ORDER — ONDANSETRON HCL 4 MG PO TABS
4.0000 mg | ORAL_TABLET | Freq: Once | ORAL | Status: AC
Start: 2018-02-20 — End: 2018-02-20
  Administered 2018-02-20: 4 mg via ORAL
  Filled 2018-02-20: qty 1

## 2018-02-20 NOTE — ED Provider Notes (Signed)
[facility] Emergency Department Provider Note  ____________________________________________   First MD Initiated Contact with Patient 02/20/18 0700     (approximate)  I have reviewed the triage vital signs and the nursing notes.   HISTORY  Chief Complaint Chest Pain and Emesis   HPI Mary Ware is a 23 y.o. female with a history of pulmonary valve stenosis who is presenting with chest pain.  The patient says that she woke up and vomited and experienced sharp chest pain after vomiting.  She says that the pain was severe initially but is now a 2 out of 10.  Denies any shortness of breath.  Denies any nausea at this point although she says that she has vomited total of 67 times since 4 AM today.  Patient says that whenever she gets chest pain she becomes concerned because of her history of pulmonary stenosis.  However, the patient says that her stenosis has resolved.  Patient also admits to a history of GERD.  However, states that she does not have a burning pain and this feels different than her past episodes of GERD.  States the chest pain is to the lower sternum.  Nonradiating.   Past Medical History:  Diagnosis Date  . GERD (gastroesophageal reflux disease)   . Heart murmur    h/o r/t pulmonary valve stenosis  . Intractable vomiting   . Kidney stones   . Pulmonary valve stenosis    AS A CHILD-PT HAS OUTGROWN AND REVERSED ITSELF PER PT AND DOES NOT SEE A CARDIOLOGIST-ASYMPTOMATIC    Patient Active Problem List   Diagnosis Date Noted  . Psychosis (HCC) 07/12/2016  . Hypokalemia 07/12/2016  . PTSD (post-traumatic stress disorder) 07/12/2016  . Visual hallucination 07/12/2016  . Acute pancreatitis 06/02/2015  . Nausea & vomiting 06/02/2015  . Pancreatitis 06/02/2015  . Pancreatitis, acute 06/02/2015  . Infectious gastroenteritis 06/02/2015  . Cholecystitis 04/27/2015  . Active labor 07/26/2013  . Status post vacuum-assisted vaginal delivery  07/26/2013    Past Surgical History:  Procedure Laterality Date  . CHOLECYSTECTOMY N/A 04/27/2015   Procedure: LAPAROSCOPIC CHOLECYSTECTOMY WITH INTRAOPERATIVE CHOLANGIOGRAM;  Surgeon: Nadeen Landau, MD;  Location: [facility] ORS;  Service: General;  Laterality: N/A;  . NO PAST SURGERIES      Prior to Admission medications   Medication Sig Start Date End Date Taking? Authorizing Provider  ondansetron (ZOFRAN) 4 MG tablet Take 1 tablet (4 mg total) by mouth every 8 (eight) hours as needed for nausea or vomiting. 06/10/17   Mesner, Barbara Cower, MD    Allergies Coconut fatty acids  Family History  Problem Relation Age of Onset  . Cancer Mother     Social History Social History   Tobacco Use  . Smoking status: Current Every Day Smoker    Packs/day: 0.50    Types: Cigarettes  . Smokeless tobacco: Never Used  Substance Use Topics  . Alcohol use: No  . Drug use: Not Currently    Types: Heroin, Benzodiazepines    Comment: + UDS in 2016 for THC (tetrahydrocannabinol)    Review of Systems  Constitutional: No fever/chills Eyes: No visual changes. ENT: No sore throat. Cardiovascular:  Respiratory: Denies shortness of breath. Gastrointestinal: No abdominal pain.   No diarrhea.  No constipation. Genitourinary: Negative for dysuria. Musculoskeletal: Negative for back pain. Skin: Negative for rash. Neurological: Negative for headaches, focal weakness or numbness.   ____________________________________________   PHYSICAL EXAM:  VITAL SIGNS: ED Triage Vitals [02/20/18 0658]  Enc Vitals Group  BP 109/60     Pulse Rate (!) 53     Resp 13     Temp (!) 97.5 F (36.4 C)     Temp src      SpO2 97 %     Weight 135 lb (61.2 kg)     Height 5' (1.524 m)     Head Circumference      Peak Flow      Pain Score 4     Pain Loc      Pain Edu?      Excl. in GC?     Constitutional: Alert and oriented. Well appearing and in no acute distress. Eyes: Conjunctivae are normal.  Head:  Atraumatic. Nose: No congestion/rhinnorhea. Mouth/Throat: Mucous membranes are moist.  Neck: No stridor.   Cardiovascular: Normal rate, regular rhythm. Grossly normal heart sounds.  No reproducible chest pain over the area identified by the patient.   Respiratory: Normal respiratory effort.  No retractions. Lungs CTAB. Gastrointestinal: Soft and nontender. No distention. Musculoskeletal: No lower extremity tenderness nor edema.  No joint effusions. Neurologic:  Normal speech and language. No gross focal neurologic deficits are appreciated. Skin:  Skin is warm, dry and intact. No rash noted. Psychiatric: Mood and affect are normal. Speech and behavior are normal.  ____________________________________________   LABS (all labs ordered are listed, but only abnormal results are displayed)  Labs Reviewed  BASIC METABOLIC PANEL - Abnormal; Notable for the following components:      Result Value   Calcium 8.7 (*)    All other components within normal limits  CBC  TROPONIN I  POC URINE PREG, ED  POC URINE PREG, ED   ____________________________________________  EKG  ED ECG REPORT I, Arelia Longest, the attending physician, personally viewed and interpreted this ECG.   Date: 02/20/2018  EKG Time: 0720  Rate: 60  Rhythm: normal sinus rhythm  Axis: Normal  Intervals:none  ST&T Change: No ST segment elevation or depression.  Biphasic T waves in aVL.  ED ECG REPORT I, Arelia Longest, the attending physician, personally viewed and interpreted this ECG.   Date: 02/20/2018  EKG Time: 0656  Rate: 69  Rhythm: normal sinus rhythm  Axis: Normal  Intervals:none  ST&T Change: No ST segment elevation or depression.  Biphasic T waves in aVL.  ____________________________________________  RADIOLOGY  Chest x-ray without acute disease. ____________________________________________   PROCEDURES  Procedure(s) performed:   Procedures  Critical Care performed:    ____________________________________________   INITIAL IMPRESSION / ASSESSMENT AND PLAN / ED COURSE  Pertinent labs & imaging results that were available during my care of the patient were reviewed by me and considered in my medical decision making (see chart for details).  Differential diagnosis includes, but is not limited to, ACS, aortic dissection, pulmonary embolism, cardiac tamponade, pneumothorax, pneumonia, pericarditis, myocarditis, GI-related causes including esophagitis/gastritis, and musculoskeletal chest wall pain.   As part of my medical decision making, I reviewed the following data within the electronic MEDICAL RECORD NUMBER Notes from prior ED visits  Differential diagnosis includes, but is not limited to, ACS, aortic dissection, pulmonary embolism, cardiac tamponade, pneumothorax, pneumonia, pericarditis, myocarditis, GI-related causes including esophagitis/gastritis, and musculoskeletal chest wall pain.   As part of my medical decision making, I reviewed the following data within the electronic MEDICAL RECORD NUMBER Notes from prior ED visits  PERC negative.    ----------------------------------------- 9:21 AM on 02/20/2018 -----------------------------------------  Patient at this time is vomited x1.  Very reassuring cardiac work-up.  I do not believe that her chest pain is related to her pulmonary valve stenosis which she had reported previously that has resolved.  No murmur auscultated.  Possibly musculoskeletal/chest wall pain from retching.  Patient has vomited x1 here in the emergency department.  However, states that she would like to be discharged home at this time.  Will take a p.o. Zofran and says that she has Zofran at home for use and this has been effective in the past.  I offered the patient an IV fluids as well as meds.  However, she says that she will try the p.o. meds and would like to be discharged.  Will return for any worsening or concerning symptoms.  Patient  does appear to have recurrent nausea and vomiting.  Admits to marijuana use.  We discussed stopping marijuana use and cause of cyclic vomiting.  She is understanding the diagnosis well treatment and willing to comply. ____________________________________________   FINAL CLINICAL IMPRESSION(S) / ED DIAGNOSES  Vomiting.  Chest pain.   NEW MEDICATIONS STARTED DURING THIS VISIT:  New Prescriptions   No medications on file     Note:  This document was prepared using Dragon voice recognition software and may include unintentional dictation errors.     Myrna BlazerSchaevitz, Adelle Zachar Matthew, MD 02/20/18 [phone]

## 2018-02-20 NOTE — ED Notes (Signed)
Called to room by pt, pt has thrown up on floor beside bed.  Yellow, liquid emesis beside bed.  NAD noted, will continue to monitor.

## 2018-02-20 NOTE — ED Triage Notes (Signed)
Patient c/o medial chest pain, emesis, and weakness.

## 2018-03-24 ENCOUNTER — Emergency Department (HOSPITAL_COMMUNITY)
Admission: EM | Admit: 2018-03-24 | Discharge: 2018-03-24 | Discharge status: Against Medical Advice | Payer: Medicaid Other | Attending: Emergency Medicine | Admitting: Emergency Medicine

## 2018-03-24 ENCOUNTER — Other Ambulatory Visit: Payer: Self-pay

## 2018-03-24 ENCOUNTER — Encounter (HOSPITAL_COMMUNITY): Payer: Self-pay | Admitting: Emergency Medicine

## 2018-03-24 DIAGNOSIS — R112 Nausea with vomiting, unspecified: Secondary | ICD-10-CM | POA: Insufficient documentation

## 2018-03-24 DIAGNOSIS — E876 Hypokalemia: Secondary | ICD-10-CM

## 2018-03-24 DIAGNOSIS — R51 Headache: Secondary | ICD-10-CM | POA: Insufficient documentation

## 2018-03-24 DIAGNOSIS — F17210 Nicotine dependence, cigarettes, uncomplicated: Secondary | ICD-10-CM | POA: Insufficient documentation

## 2018-03-24 DIAGNOSIS — R519 Headache, unspecified: Secondary | ICD-10-CM

## 2018-03-24 LAB — URINALYSIS, ROUTINE W REFLEX MICROSCOPIC
Glucose, UA: NEGATIVE mg/dL
Hgb urine dipstick: NEGATIVE
Ketones, ur: 80 mg/dL — AB
Nitrite: NEGATIVE
PH: 6 (ref 5.0–8.0)
Protein, ur: 100 mg/dL — AB
Specific Gravity, Urine: 1.029 (ref 1.005–1.030)

## 2018-03-24 LAB — COMPREHENSIVE METABOLIC PANEL
ALT: 11 U/L (ref 0–44)
AST: 17 U/L (ref 15–41)
Albumin: 4.5 g/dL (ref 3.5–5.0)
Alkaline Phosphatase: 48 U/L (ref 38–126)
Anion gap: 10 (ref 5–15)
BILIRUBIN TOTAL: 0.9 mg/dL (ref 0.3–1.2)
BUN: 7 mg/dL (ref 6–20)
CHLORIDE: 107 mmol/L (ref 98–111)
CO2: 21 mmol/L — ABNORMAL LOW (ref 22–32)
Calcium: 9.1 mg/dL (ref 8.9–10.3)
Creatinine, Ser: 0.53 mg/dL (ref 0.44–1.00)
GFR calc Af Amer: >60 mL/min (ref >60)
Glucose, Bld: 134 mg/dL — ABNORMAL HIGH (ref 70–99)
POTASSIUM: 2.8 mmol/L — AB (ref 3.5–5.1)
Sodium: 138 mmol/L (ref 135–145)
TOTAL PROTEIN: 6.9 g/dL (ref 6.5–8.1)

## 2018-03-24 LAB — RAPID URINE DRUG SCREEN, HOSP PERFORMED
Amphetamines: NOT DETECTED
BENZODIAZEPINES: NOT DETECTED
Barbiturates: NOT DETECTED
Cocaine: NOT DETECTED
Opiates: NOT DETECTED
Tetrahydrocannabinol: POSITIVE — AB

## 2018-03-24 LAB — DIFFERENTIAL
Basophils Absolute: 0.1 10*3/uL (ref 0.0–0.1)
Basophils Relative: 0 %
EOS PCT: 0 %
Eosinophils Absolute: 0.1 10*3/uL (ref 0.0–0.5)
Lymphocytes Relative: 10 %
Lymphs Abs: 1.6 10*3/uL (ref 0.7–4.0)
MONO ABS: 1 10*3/uL (ref 0.1–1.0)
MONOS PCT: 6 %
Neutro Abs: 13.6 10*3/uL — ABNORMAL HIGH (ref 1.7–7.7)
Neutrophils Relative %: 83 %

## 2018-03-24 LAB — CBC
HCT: 44 % (ref 36.0–46.0)
Hemoglobin: 14.9 g/dL (ref 12.0–15.0)
MCH: 31.4 pg (ref 26.0–34.0)
MCHC: 33.9 g/dL (ref 30.0–36.0)
MCV: 92.8 fL (ref 80.0–100.0)
NRBC: 0 % (ref 0.0–0.2)
Platelets: 211 10*3/uL (ref 150–400)
RBC: 4.74 MIL/uL (ref 3.87–5.11)
RDW: 12 % (ref 11.5–15.5)
WBC: 16.7 10*3/uL — ABNORMAL HIGH (ref 4.0–10.5)

## 2018-03-24 LAB — LIPASE, BLOOD: LIPASE: 33 U/L (ref 11–51)

## 2018-03-24 LAB — I-STAT BETA HCG BLOOD, ED (MC, WL, AP ONLY): I-stat hCG, quantitative: 6.4 m[IU]/mL — ABNORMAL HIGH (ref <5)

## 2018-03-24 LAB — HCG, QUANTITATIVE, PREGNANCY: hCG, Beta Chain, Quant, S: <1 m[IU]/mL (ref <5)

## 2018-03-24 MED ORDER — POTASSIUM CHLORIDE 20 MEQ/15ML (10%) PO SOLN
40.0000 meq | Freq: Once | ORAL | Status: DC
  Filled 2018-03-24: qty 30

## 2018-03-24 MED ORDER — POTASSIUM CHLORIDE 0.4 MEQ/ML IV SOLN: 20.0000 meq | Freq: Two times a day (BID) | INTRAVENOUS | 0 refills | Status: DC

## 2018-03-24 MED ORDER — POTASSIUM CHLORIDE 10 MEQ/100ML IV SOLN
10.0000 meq | Freq: Once | INTRAVENOUS | Status: AC
  Administered 2018-03-24: 10 meq via INTRAVENOUS
  Filled 2018-03-24: qty 100

## 2018-03-24 MED ORDER — KETOROLAC TROMETHAMINE 15 MG/ML IJ SOLN
15.0000 mg | Freq: Once | INTRAMUSCULAR | Status: AC
  Administered 2018-03-24: 15 mg via INTRAVENOUS
  Filled 2018-03-24: qty 1

## 2018-03-24 MED ORDER — SODIUM CHLORIDE 0.9 % IV BOLUS
1000.0000 mL | Freq: Once | INTRAVENOUS | Status: AC
  Administered 2018-03-24: 1000 mL via INTRAVENOUS

## 2018-03-24 MED ORDER — METOCLOPRAMIDE HCL 5 MG/ML IJ SOLN
10.0000 mg | Freq: Once | INTRAMUSCULAR | Status: AC
  Administered 2018-03-24: 10 mg via INTRAVENOUS
  Filled 2018-03-24: qty 2

## 2018-03-24 MED ORDER — SODIUM CHLORIDE 0.9% FLUSH
3.0000 mL | Freq: Once | INTRAVENOUS | Status: AC
  Administered 2018-03-24: 3 mL via INTRAVENOUS

## 2018-03-24 NOTE — ED Notes (Signed)
Pt husband states pt seen at [facility] Thursday past and all testing done was negative

## 2018-03-24 NOTE — ED Triage Notes (Signed)
Pt presents by [facility] for evaluation of altered mental status. EMS reports that pt was running through house and crawling under tables while at home and yelling "Don't let them get me". Family reports pt having vomiting for the last 2 days and was seen at [facility] for similar symptoms and was told it was due to anxiety/migranes. Pt was given 4mg  Zofran during transport to aid with vomiting.

## 2018-03-24 NOTE — Discharge Instructions (Signed)
You have chosen to leave AGAINST MEDICAL ADVICE prior to your work-up being completed.  You may have a serious or life-threatening condition that cannot be ruled out at this time.  Your potassium today was found to be low.  It is important that you take potassium as prescribed.  It is important to follow-up with primary care regarding your daily headaches and these frequent episodes of headaches/nausea/mental status changes.  Use phenergan as needed for nausea or vomiting.  Return to the ER with any new, worsening, or concerning symptoms.

## 2018-03-24 NOTE — ED Provider Notes (Signed)
[facility]-EMERGENCY DEPT Provider Note   CSN: [id number] Arrival date & time: 03/24/18  8466    History   Chief Complaint Chief Complaint  Patient presents with  . Altered Mental Status  . Emesis    HPI Mary Ware is a 23 y.o. female presenting for evaluation of headache, vomiting, and altered mental status.  Patient for the most part refusing to talk to me, history provided by her husband.  Per husband, patient woke up in the middle the night with a headache.  She then had several episodes of emesis, and at one point was very paranoid.  Husband states paranoia/altered mental status is resolved, but she is still having vomiting.  Patient reports headache is persistent and severe.  It is located on the right side.  Per husband, patient has a history of migraines, does not take anything for this.  Patient has not had anything for pain including Tylenol or ibuprofen.  Patient takes Phenergan as needed for nausea or vomiting, had some last night prior to going to bed, but none since.  She has no other medical problems.  Patient denies fevers, chills, chest pain, abdominal pain, urinary symptoms, abnormal bowel movements.  Additional history obtained from chart review.  Patient has been seen 2 times in the past month for the same, been seen multiple times in the past year for a combination of headache, vomiting, and transient altered mental status.     HPI  Past Medical History:  Diagnosis Date  . GERD (gastroesophageal reflux disease)   . Heart murmur    h/o r/t pulmonary valve stenosis  . Intractable vomiting   . Kidney stones   . Pulmonary valve stenosis    AS A CHILD-PT HAS OUTGROWN AND REVERSED ITSELF PER PT AND DOES NOT SEE A CARDIOLOGIST-ASYMPTOMATIC    Patient Active Problem List   Diagnosis Date Noted  . Psychosis (HCC) 07/12/2016  . Hypokalemia 07/12/2016  . PTSD (post-traumatic stress disorder) 07/12/2016  . Visual hallucination  07/12/2016  . Acute pancreatitis 06/02/2015  . Nausea & vomiting 06/02/2015  . Pancreatitis 06/02/2015  . Pancreatitis, acute 06/02/2015  . Infectious gastroenteritis 06/02/2015  . Cholecystitis 04/27/2015  . Active labor 07/26/2013  . Status post vacuum-assisted vaginal delivery 07/26/2013    Past Surgical History:  Procedure Laterality Date  . CHOLECYSTECTOMY N/A 04/27/2015   Procedure: LAPAROSCOPIC CHOLECYSTECTOMY WITH INTRAOPERATIVE CHOLANGIOGRAM;  Surgeon: Nadeen Landau, MD;  Location: [facility] ORS;  Service: General;  Laterality: N/A;  . NO PAST SURGERIES       OB History    Gravida  1   Para  1   Term  1   Preterm      AB      Living  1     SAB      TAB      Ectopic      Multiple      Live Births  1            Home Medications    Prior to Admission medications   Medication Sig Start Date End Date Taking? Authorizing Provider  ondansetron (ZOFRAN) 4 MG tablet Take 1 tablet (4 mg total) by mouth every 8 (eight) hours as needed for nausea or vomiting. Patient not taking: Reported on 03/24/2018 06/10/17   Mesner, Barbara Cower, MD  potassium chloride 0.4 MEQ/ML Inject 50 mLs (20 mEq total) into the vein 2 (two) times daily for 7 doses. 03/24/18 03/28/18  Hendrixx Severin, Jeanette Caprice, PA-C  Family History Family History  Problem Relation Age of Onset  . Cancer Mother     Social History Social History   Tobacco Use  . Smoking status: Current Every Day Smoker    Packs/day: 0.50    Types: Cigars  . Smokeless tobacco: Never Used  Substance Use Topics  . Alcohol use: No  . Drug use: Not Currently    Types: Heroin, Benzodiazepines    Comment: + UDS in 2016 for THC (tetrahydrocannabinol)     Allergies   Coconut fatty acids   Review of Systems Review of Systems  Gastrointestinal: Positive for nausea and vomiting.  Neurological: Positive for headaches.  Psychiatric/Behavioral: Positive for hallucinations (and paranoia, resolved).  All other systems reviewed  and are negative.    Physical Exam Updated Vital Signs BP 124/67 (BP Location: Right Arm)   Pulse 66   Temp 97.8 F (36.6 C) (Oral)   Resp 20   Ht 5' (1.524 m)   Wt 52.6 kg   LMP  (LMP Unknown)   SpO2 100%   BMI 22.65 kg/m   Physical Exam Vitals signs and nursing note reviewed.  Constitutional:      General: She is not in acute distress.    Appearance: She is well-developed.     Comments: Appears dehydrated, in NAD  HENT:     Head: Normocephalic and atraumatic.     Mouth/Throat:     Mouth: Mucous membranes are dry.     Comments: MM dry Eyes:     Conjunctiva/sclera: Conjunctivae normal.     Pupils: Pupils are equal, round, and reactive to light.  Neck:     Musculoskeletal: Normal range of motion and neck supple.  Cardiovascular:     Rate and Rhythm: Normal rate and regular rhythm.     Pulses: Normal pulses.  Pulmonary:     Effort: Pulmonary effort is normal. No respiratory distress.     Breath sounds: Normal breath sounds. No wheezing.  Abdominal:     General: There is no distension.     Palpations: Abdomen is soft. There is no mass.     Tenderness: There is no abdominal tenderness. There is no guarding or rebound.     Comments: No ttp of the abd. Soft without rigidity, guarding, or distention  Musculoskeletal: Normal range of motion.     Comments: Strength and sensation intact x4  Skin:    General: Skin is warm and dry.     Capillary Refill: Capillary refill takes less than 2 seconds.     Findings: Rash present.     Comments: Lacy rash noted on lateral aspect of the left leg.  No erythema or warmth.  No tenderness.  Neurological:     General: No focal deficit present.     Mental Status: She is alert and oriented to person, place, and time.     Comments: Alert and oriented.  No obvious neurologic deficits.  Psychiatric:        Mood and Affect: Mood is anxious.     Comments: Not responding to external stimuli. Denies si, hi or avh. No paranoia. Pt is anxious       ED Treatments / Results  Labs (all labs ordered are listed, but only abnormal results are displayed) Labs Reviewed  COMPREHENSIVE METABOLIC PANEL - Abnormal; Notable for the following components:      Result Value   Potassium 2.8 (*)    CO2 21 (*)    Glucose, Bld 134 (*)  All other components within normal limits  CBC - Abnormal; Notable for the following components:   WBC 16.7 (*)    All other components within normal limits  URINALYSIS, ROUTINE W REFLEX MICROSCOPIC - Abnormal; Notable for the following components:   Color, Urine AMBER (*)    APPearance CLOUDY (*)    Bilirubin Urine SMALL (*)    Ketones, ur 80 (*)    Protein, ur 100 (*)    Leukocytes,Ua TRACE (*)    Bacteria, UA MANY (*)    All other components within normal limits  RAPID URINE DRUG SCREEN, HOSP PERFORMED - Abnormal; Notable for the following components:   Tetrahydrocannabinol POSITIVE (*)    All other components within normal limits  DIFFERENTIAL - Abnormal; Notable for the following components:   Neutro Abs 13.6 (*)    All other components within normal limits  I-STAT BETA HCG BLOOD, ED ([facility], [facility], [facility] ONLY) - Abnormal; Notable for the following components:   I-stat hCG, quantitative 6.4 (*)    All other components within normal limits  LIPASE, BLOOD  HCG, QUANTITATIVE, PREGNANCY    EKG None  Radiology No results found.  Procedures Procedures (including critical care time)  Medications Ordered in ED Medications  potassium chloride 20 MEQ/15ML (10%) solution 40 mEq (0 mEq Oral Hold 03/24/18 0724)  sodium chloride flush (NS) 0.9 % injection 3 mL (3 mLs Intravenous Given 03/24/18 0536)  sodium chloride 0.9 % bolus 1,000 mL (0 mLs Intravenous Stopped 03/24/18 0801)  metoCLOPramide (REGLAN) injection 10 mg (10 mg Intravenous Given 03/24/18 0723)  potassium chloride 10 mEq in 100 mL IVPB (0 mEq Intravenous Stopped 03/24/18 0801)  ketorolac (TORADOL) 15 MG/ML injection 15 mg (15 mg Intravenous Given  03/24/18 0748)     Initial Impression / Assessment and Plan / ED Course  I have reviewed the triage vital signs and the nursing notes.  Pertinent labs & imaging results that were available during my care of the patient were reviewed by me and considered in my medical decision making (see chart for details).        Patient presenting for evaluation of headache, nausea, and altered mental status.  Physical exam shows patient whos ental status is back to baseline, and appears nontoxic.  She does appear dehydrated.  Abdominal exam is reassuring, doubt intra-abdominal infection, perforation, obstruction, or surgical abdomen.  Per chart review, patient with history of similar, symptoms often presenting together.  Has been diagnosed with migraine/anxiety a possible cause.  Additionally, on exam patient has a lacy rash noted along the lateral aspect of her left leg.  Discussed with patient, who states it has been there for about a month.  She recently saw a PCP who ordered blood work to rule out leukemia.  She does not have the results back yet.  Patient is very anxious about this. Labs with leukocytosis at 16.7, although improved from 20 last week.  Potassium low at 2.8, likely due to vomiting.  Will add on differential.  I-STAT hCG mildly +6.4, patient states she is not pregnant and has not been sexually active recently.  As such, consider false positive.  Will add on hCG quant.  Will order urine and UDS for further evaluation.  Fluids and Reglan given.  Will hold on Toradol until hCG quant has returned.  Potassium given.  Quant negative, as such the i-STAT was likely a false positive.  Toradol given for headache.  Informed by RN that patient wants to leave immediately.  Discussed  that this would have to be AGAINST MEDICAL ADVICE, as we do not have her labs back, and her potassium is low.  Pt informed that she could have a serious or life-threatening event. Patient states she understands and signed AMA  paperwork.  At this time, as she does not have any SI, HI, or current AVH, and I believe she has mental capacity to make decisions for herself, I cannot keep patient against her will, she does not meet IVC standards. She did not wait for her discharge paperwork in which I prescribed potassium.   Final Clinical Impressions(s) / ED Diagnoses   Final diagnoses:  Hypokalemia  Non-intractable vomiting with nausea, unspecified vomiting type  Acute nonintractable headache, unspecified headache type    ED Discharge Orders         Ordered    potassium chloride 0.4 MEQ/ML  2 times daily     03/24/18 0806           Alveria ApleyCaccavale, Karna Abed, PA-C 03/24/18 [id number]    Devoria AlbeKnapp, Iva, MD 03/29/18 2304

## 2018-03-24 NOTE — ED Notes (Signed)
Pt currently denies any SI/HI at this time. Pt is calm and cooperative but slow to answer questions.

## 2018-03-24 NOTE — ED Notes (Signed)
Patient signed AMA paperwork. Patient was advised to wait. Patient stated they could no longer wait. Patient stated they were having anxiety by staying in hospital.

## 2018-03-24 NOTE — ED Notes (Signed)
Bed: WA03 Expected date:  Expected time:  Means of arrival:  Comments: 23 yo F/ Behavioral

## 2022-02-19 ENCOUNTER — Telehealth: Payer: Self-pay

## 2022-02-19 NOTE — Telephone Encounter (Signed)
Mychart msg sent. AS, CMA 

## 2022-08-20 DIAGNOSIS — Z3046 Encounter for surveillance of implantable subdermal contraceptive: Secondary | ICD-10-CM | POA: Diagnosis not present

## 2023-02-04 ENCOUNTER — Emergency Department (HOSPITAL_COMMUNITY)
Admission: EM | Admit: 2023-02-04 | Discharge: 2023-02-04 | Disposition: A | Discharge status: Home / Self Care | Payer: Medicaid Other | Attending: Emergency Medicine | Admitting: Emergency Medicine

## 2023-02-04 ENCOUNTER — Emergency Department (HOSPITAL_COMMUNITY): Payer: Medicaid Other

## 2023-02-04 ENCOUNTER — Encounter (HOSPITAL_COMMUNITY): Payer: Self-pay

## 2023-02-04 DIAGNOSIS — R0689 Other abnormalities of breathing: Secondary | ICD-10-CM | POA: Diagnosis not present

## 2023-02-04 DIAGNOSIS — R109 Unspecified abdominal pain: Secondary | ICD-10-CM | POA: Diagnosis not present

## 2023-02-04 DIAGNOSIS — R112 Nausea with vomiting, unspecified: Secondary | ICD-10-CM | POA: Insufficient documentation

## 2023-02-04 DIAGNOSIS — R1084 Generalized abdominal pain: Secondary | ICD-10-CM | POA: Diagnosis not present

## 2023-02-04 DIAGNOSIS — R1010 Upper abdominal pain, unspecified: Secondary | ICD-10-CM

## 2023-02-04 DIAGNOSIS — R1111 Vomiting without nausea: Secondary | ICD-10-CM | POA: Diagnosis not present

## 2023-02-04 DIAGNOSIS — R110 Nausea: Secondary | ICD-10-CM | POA: Diagnosis not present

## 2023-02-04 DIAGNOSIS — E876 Hypokalemia: Secondary | ICD-10-CM | POA: Diagnosis not present

## 2023-02-04 DIAGNOSIS — R1011 Right upper quadrant pain: Secondary | ICD-10-CM | POA: Diagnosis not present

## 2023-02-04 LAB — URINALYSIS, W/ REFLEX TO CULTURE (INFECTION SUSPECTED)
Bacteria, UA: NONE SEEN
Bilirubin Urine: NEGATIVE
Glucose, UA: NEGATIVE mg/dL
Ketones, ur: 5 mg/dL — AB
Leukocytes,Ua: NEGATIVE
Nitrite: NEGATIVE
Protein, ur: 30 mg/dL — AB
Specific Gravity, Urine: 1.019 (ref 1.005–1.030)
pH: 5 (ref 5.0–8.0)

## 2023-02-04 LAB — CBC WITH DIFFERENTIAL/PLATELET
Abs Immature Granulocytes: 0.06 10*3/uL (ref 0.00–0.07)
Basophils Absolute: 0.1 10*3/uL (ref 0.0–0.1)
Basophils Relative: 0 %
Eosinophils Absolute: 0 10*3/uL (ref 0.0–0.5)
Eosinophils Relative: 0 %
HCT: 43.9 % (ref 36.0–46.0)
Hemoglobin: 15 g/dL (ref 12.0–15.0)
Immature Granulocytes: 1 %
Lymphocytes Relative: 13 %
Lymphs Abs: 1.5 10*3/uL (ref 0.7–4.0)
MCH: 32.3 pg (ref 26.0–34.0)
MCHC: 34.2 g/dL (ref 30.0–36.0)
MCV: 94.4 fL (ref 80.0–100.0)
Monocytes Absolute: 0.6 10*3/uL (ref 0.1–1.0)
Monocytes Relative: 5 %
Neutro Abs: 9.9 10*3/uL — ABNORMAL HIGH (ref 1.7–7.7)
Neutrophils Relative %: 81 %
Platelets: 242 10*3/uL (ref 150–400)
RBC: 4.65 MIL/uL (ref 3.87–5.11)
RDW: 12.2 % (ref 11.5–15.5)
WBC: 12.2 10*3/uL — ABNORMAL HIGH (ref 4.0–10.5)
nRBC: 0 % (ref 0.0–0.2)

## 2023-02-04 LAB — COMPREHENSIVE METABOLIC PANEL
ALT: 13 U/L (ref 0–44)
AST: 22 U/L (ref 15–41)
Albumin: 4.3 g/dL (ref 3.5–5.0)
Alkaline Phosphatase: 46 U/L (ref 38–126)
Anion gap: 7 (ref 5–15)
BUN: 8 mg/dL (ref 6–20)
CO2: 17 mmol/L — ABNORMAL LOW (ref 22–32)
Calcium: 8.6 mg/dL — ABNORMAL LOW (ref 8.9–10.3)
Chloride: 113 mmol/L — ABNORMAL HIGH (ref 98–111)
Creatinine, Ser: 0.37 mg/dL — ABNORMAL LOW (ref 0.44–1.00)
GFR, Estimated: >60 mL/min (ref >60)
Glucose, Bld: 113 mg/dL — ABNORMAL HIGH (ref 70–99)
Potassium: 3.4 mmol/L — ABNORMAL LOW (ref 3.5–5.1)
Sodium: 137 mmol/L (ref 135–145)
Total Bilirubin: 0.9 mg/dL (ref 0.0–1.2)
Total Protein: 6.9 g/dL (ref 6.5–8.1)

## 2023-02-04 LAB — RAPID URINE DRUG SCREEN, HOSP PERFORMED
Amphetamines: NOT DETECTED
Barbiturates: NOT DETECTED
Benzodiazepines: NOT DETECTED
Cocaine: NOT DETECTED
Opiates: POSITIVE — AB
Tetrahydrocannabinol: POSITIVE — AB

## 2023-02-04 LAB — HCG, SERUM, QUALITATIVE: Preg, Serum: NEGATIVE

## 2023-02-04 LAB — PREGNANCY, URINE: Preg Test, Ur: NEGATIVE

## 2023-02-04 MED ORDER — ONDANSETRON HCL 4 MG/2ML IJ SOLN
4.0000 mg | Freq: Once | INTRAMUSCULAR | Status: AC
  Administered 2023-02-04: 4 mg via INTRAVENOUS
  Filled 2023-02-04: qty 2

## 2023-02-04 MED ORDER — MORPHINE SULFATE (PF) 4 MG/ML IV SOLN
4.0000 mg | Freq: Once | INTRAVENOUS | Status: AC
  Administered 2023-02-04: 4 mg via INTRAVENOUS
  Filled 2023-02-04: qty 1

## 2023-02-04 MED ORDER — KETOROLAC TROMETHAMINE 15 MG/ML IJ SOLN
15.0000 mg | Freq: Once | INTRAMUSCULAR | Status: AC
  Administered 2023-02-04: 15 mg via INTRAVENOUS
  Filled 2023-02-04: qty 1

## 2023-02-04 MED ORDER — SODIUM CHLORIDE 0.9 % IV BOLUS
1000.0000 mL | Freq: Once | INTRAVENOUS | Status: AC
Start: 2023-02-04 — End: 2023-02-04
  Administered 2023-02-04: 1000 mL via INTRAVENOUS

## 2023-02-04 MED ORDER — ONDANSETRON 8 MG PO TBDP: 8.0000 mg | ORAL_TABLET | Freq: Three times a day (TID) | ORAL | 0 refills | Status: DC | PRN

## 2023-02-04 MED ORDER — POTASSIUM CHLORIDE CRYS ER 20 MEQ PO TBCR
40.0000 meq | EXTENDED_RELEASE_TABLET | Freq: Once | ORAL | Status: AC
  Administered 2023-02-04: 40 meq via ORAL
  Filled 2023-02-04: qty 2

## 2023-02-04 NOTE — ED Provider Notes (Addendum)
 Signed out that pt with recurrent nv and abd pain symptoms and that ct pending.   CT is negative for acute process.   Vitals normal. Tolerating po. No abd pain. No recurret nv.   ?possible CHS as cause of symptoms.   Pt currently appears stable for d/c.   Rec pcp f/u.  Return precautions provided.       Bernard Drivers, MD 02/04/23 [phone]

## 2023-02-04 NOTE — Discharge Instructions (Addendum)
 It was our pleasure to provide your ER care today - we hope that you feel better.  Drink plenty of fluids/stay well hydrated.   Take acetaminophen  or ibuprofen  as need. Take zofran  as need for nausea.   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   Your potassium level is mildly low - eat plenty of fruits and vegetables, and follow up with primary care doctor.   Return to ER right away  if worse, fevers, new symptoms, new or worsening or severe abdominal pain, persistent vomiting, chest pain, trouble breathing, or other emergency concern.  You were given pain meds in the ER - no driving for the next 6 hours.

## 2023-02-04 NOTE — ED Provider Notes (Signed)
 [place] EMERGENCY DEPARTMENT AT [facility] Provider Note   CSN: [id number] Arrival date & time: 02/04/23  1230     History  Chief Complaint  Patient presents with   Flank Pain    Mary Ware is a 27 y.o. female.  27 yo F with a chief complaints of right-sided flank pain.  This been going on since earlier this morning.  Having significant pain and intractable nausea and vomiting.  Has a history of kidney stones and thinks this feels somewhat similar.  Denies any prior need for surgery of the kidney stone.  No fevers.  No urinary symptoms.   Flank Pain       Home Medications Prior to Admission medications   Medication Sig Start Date End Date Taking? Authorizing Provider  ondansetron  (ZOFRAN ) 4 MG tablet Take 1 tablet (4 mg total) by mouth every 8 (eight) hours as needed for nausea or vomiting. Patient not taking: Reported on 03/24/2018 06/10/17   Mesner, Selinda, MD  potassium chloride  0.4 MEQ/ML Inject 50 mLs (20 mEq total) into the vein 2 (two) times daily for 7 doses. 03/24/18 03/28/18  Caccavale, Sophia, PA-C      Allergies    Coconut fatty acid    Review of Systems   Review of Systems  Genitourinary:  Positive for flank pain.    Physical Exam Updated Vital Signs BP (!) 159/88 (BP Location: Left Arm)   Pulse (!) 59   Temp 98 F (36.7 C) (Oral)   Resp 20   LMP 01/04/2023   SpO2 98%  Physical Exam Vitals and nursing note reviewed.  Constitutional:      General: She is not in acute distress.    Appearance: She is well-developed. She is not diaphoretic.  HENT:     Head: Normocephalic and atraumatic.  Eyes:     Pupils: Pupils are equal, round, and reactive to light.  Cardiovascular:     Rate and Rhythm: Normal rate and regular rhythm.     Heart sounds: No murmur heard.    No friction rub. No gallop.  Pulmonary:     Effort: Pulmonary effort is normal.     Breath sounds: No wheezing or rales.  Abdominal:     General: There is no  distension.     Palpations: Abdomen is soft.     Tenderness: There is no abdominal tenderness.  Musculoskeletal:        General: No tenderness.     Cervical back: Normal range of motion and neck supple.  Skin:    General: Skin is warm and dry.  Neurological:     Mental Status: She is alert and oriented to person, place, and time.  Psychiatric:        Behavior: Behavior normal.     ED Results / Procedures / Treatments   Labs (all labs ordered are listed, but only abnormal results are displayed) Labs Reviewed  COMPREHENSIVE METABOLIC PANEL - Abnormal; Notable for the following components:      Result Value   Potassium 3.4 (*)    Chloride 113 (*)    CO2 17 (*)    Glucose, Bld 113 (*)    Creatinine, Ser 0.37 (*)    Calcium 8.6 (*)    All other components within normal limits  CBC WITH DIFFERENTIAL/PLATELET - Abnormal; Notable for the following components:   WBC 12.2 (*)    Neutro Abs 9.9 (*)    All other components within normal limits  HCG, SERUM,  QUALITATIVE  URINALYSIS, W/ REFLEX TO CULTURE (INFECTION SUSPECTED)  POC URINE PREG, ED    EKG None  Radiology No results found.  Procedures Procedures    Medications Ordered in ED Medications  ketorolac  (TORADOL ) 15 MG/ML injection 15 mg (15 mg Intravenous Given 02/04/23 1246)  morphine  (PF) 4 MG/ML injection 4 mg (4 mg Intravenous Given 02/04/23 1247)  ondansetron  (ZOFRAN ) injection 4 mg (4 mg Intravenous Given 02/04/23 1247)  morphine  (PF) 4 MG/ML injection 4 mg (4 mg Intravenous Given 02/04/23 1348)  ondansetron  (ZOFRAN ) injection 4 mg (4 mg Intravenous Given 02/04/23 1348)  sodium chloride  0.9 % bolus 1,000 mL (1,000 mLs Intravenous New Bag/Given 02/04/23 1343)    ED Course/ Medical Decision Making/ A&P                                 Medical Decision Making Amount and/or Complexity of Data Reviewed Labs: ordered. Radiology: ordered.  Risk Prescription drug management.   27 yo F with a cc of right  flank pain.  Patient has a history of kidney stones and thinks this feels similar.  She was given a round of pain medicine upfront with some improvement but still feeling quite unwell.  Will repeat meds here.  Lab work CT imaging.  Reassess.  Labs with mild leukocytosis. Mild acidosis without anion gap.  Preg negative.  Give fluids.    Patient care signed out to Dr. Bernard, please see their note for further details of care in the ED.  The patients results and plan were reviewed and discussed.   Any x-rays performed were independently reviewed by myself.   Differential diagnosis were considered with the presenting HPI.  Medications  ketorolac  (TORADOL ) 15 MG/ML injection 15 mg (15 mg Intravenous Given 02/04/23 1246)  morphine  (PF) 4 MG/ML injection 4 mg (4 mg Intravenous Given 02/04/23 1247)  ondansetron  (ZOFRAN ) injection 4 mg (4 mg Intravenous Given 02/04/23 1247)  morphine  (PF) 4 MG/ML injection 4 mg (4 mg Intravenous Given 02/04/23 1348)  ondansetron  (ZOFRAN ) injection 4 mg (4 mg Intravenous Given 02/04/23 1348)  sodium chloride  0.9 % bolus 1,000 mL (1,000 mLs Intravenous New Bag/Given 02/04/23 1343)    Vitals:   02/04/23 1237  BP: (!) 159/88  Pulse: (!) 59  Resp: 20  Temp: 98 F (36.7 C)  TempSrc: Oral  SpO2: 98%    Final diagnoses:  Right flank pain    Admission/ observation were discussed with the admitting physician, patient and/or family and they are comfortable with the plan.          Final Clinical Impression(s) / ED Diagnoses Final diagnoses:  Right flank pain    Rx / DC Orders ED Discharge Orders     None         Emil Share, DO 02/04/23 1537

## 2023-02-04 NOTE — ED Triage Notes (Signed)
Pt presents with c/o flank pain on the right side that wraps around to her abdomen. Pt also has active vomiting and reports she has not urinated since yesterday afternoon. Hx of kidney stones.

## 2023-02-04 NOTE — ED Notes (Signed)
Pt stated she should be able to urinate soon after she finishes her cup of ice.

## 2023-02-04 NOTE — ED Provider Triage Note (Signed)
 Emergency Medicine Provider Triage Evaluation Note  Mary Ware , a 27 y.o. female  was evaluated in triage.  Pt complains of flank pain on the right side that wraps around her abdomen.  She is also having nausea with vomiting.  Patient reports she has not urinated since yesterday afternoon.  She does have history of kidney stones.  Review of Systems  Positive: As above Negative: As above  Physical Exam  BP (!) 159/88 (BP Location: Left Arm)   Pulse (!) 59   Temp 98 F (36.7 C) (Oral)   Resp 20   LMP 01/04/2023   SpO2 98%  Gen:   Awake, appears to be in significant pain, spitting up  Resp:  Normal effort  MSK:   Moves extremities without difficulty  Other:    Medical Decision Making  Medically screening exam initiated at 12:38 PM.  Appropriate orders placed.  Ruie L Foell was informed that the remainder of the evaluation will be completed by another provider, this initial triage assessment does not replace that evaluation, and the importance of remaining in the ED until their evaluation is complete.     Gretta Sayres R, PA-C 02/04/23 1239

## 2023-02-05 NOTE — L&D Delivery Note (Signed)
 Delivery Note:   G2P1001 at [redacted]w[redacted]d  Admitting diagnosis: Normal labor [O80, Z37.9] Risks: FGR, 1.7% at 33 weeks; suspected abruption Onset of labor: 12/09/2023 at 0919 IOL/Augmentation: AROM and Pitocin  ROM: 12/09/2023 at 0919, amber colored fluid  Complete dilation at 12/09/2023 1310 Onset of pushing at 1312 FHR second stage Cat II, variables with pushing  Analgesia/Anesthesia intrapartum:Epidural Pushing in lithotomy position with CNM and L&D staff support. Husband, Elspeth, present for birth and supportive.  Delivery of a Live born female  Birth Weight: 4 lb 11.1 oz (2130 g) APGAR: 9, 9  Newborn Delivery   Birth date/time: 12/09/2023 13:22:00 Delivery type: Vaginal, Spontaneous    in cephalic presentation, position OA to LOA.  APGAR:1 min-9 , 5 min-9   Nuchal Cord: No  Cord double clamped after cessation of pulsation, cut by Elspeth.  Collection of cord blood for typing completed. Arterial cord blood sample-No   Placenta delivered-Spontaneous with 3 vessels. Uterotonics: Pitocin , TXA Placenta to pathology Uterine tone firm  Bleeding small  None laceration identified.  Episiotomy:None Local analgesia: N/A  Repair: N/A Est. Blood Loss (mL):186.00  Complications: Placental Abruption  Mom to postpartum.  Baby SueAnne to Couplet care / Skin to Skin.  Delivery Report:  Review the Delivery Report for details.    Alan MARLA Molt, CNM, MSN 12/09/2023, 2:41 PM

## 2023-05-11 ENCOUNTER — Encounter (HOSPITAL_COMMUNITY): Payer: Self-pay | Admitting: *Deleted

## 2023-05-11 ENCOUNTER — Inpatient Hospital Stay (HOSPITAL_COMMUNITY)

## 2023-05-11 ENCOUNTER — Inpatient Hospital Stay (HOSPITAL_COMMUNITY)
Admission: AD | Admit: 2023-05-11 | Discharge: 2023-05-11 | Disposition: A | Discharge status: Home / Self Care | Attending: Obstetrics and Gynecology | Admitting: Obstetrics and Gynecology

## 2023-05-11 DIAGNOSIS — Z3491 Encounter for supervision of normal pregnancy, unspecified, first trimester: Secondary | ICD-10-CM

## 2023-05-11 DIAGNOSIS — R1030 Lower abdominal pain, unspecified: Secondary | ICD-10-CM | POA: Diagnosis present

## 2023-05-11 DIAGNOSIS — Z3A01 Less than 8 weeks gestation of pregnancy: Secondary | ICD-10-CM | POA: Insufficient documentation

## 2023-05-11 DIAGNOSIS — Z3201 Encounter for pregnancy test, result positive: Secondary | ICD-10-CM | POA: Insufficient documentation

## 2023-05-11 DIAGNOSIS — O209 Hemorrhage in early pregnancy, unspecified: Secondary | ICD-10-CM | POA: Diagnosis present

## 2023-05-11 DIAGNOSIS — O26891 Other specified pregnancy related conditions, first trimester: Secondary | ICD-10-CM | POA: Diagnosis not present

## 2023-05-11 DIAGNOSIS — O208 Other hemorrhage in early pregnancy: Secondary | ICD-10-CM | POA: Diagnosis not present

## 2023-05-11 LAB — CBC
HCT: 39.4 % (ref 36.0–46.0)
Hemoglobin: 13.5 g/dL (ref 12.0–15.0)
MCH: 31.5 pg (ref 26.0–34.0)
MCHC: 34.3 g/dL (ref 30.0–36.0)
MCV: 92.1 fL (ref 80.0–100.0)
Platelets: 260 10*3/uL (ref 150–400)
RBC: 4.28 MIL/uL (ref 3.87–5.11)
RDW: 11.9 % (ref 11.5–15.5)
WBC: 10.1 10*3/uL (ref 4.0–10.5)
nRBC: 0 % (ref 0.0–0.2)

## 2023-05-11 LAB — WET PREP, GENITAL
Sperm: NONE SEEN
Trich, Wet Prep: NONE SEEN
WBC, Wet Prep HPF POC: <10 (ref <10)
Yeast Wet Prep HPF POC: NONE SEEN

## 2023-05-11 LAB — URINALYSIS, ROUTINE W REFLEX MICROSCOPIC
Bilirubin Urine: NEGATIVE
Glucose, UA: NEGATIVE mg/dL
Hgb urine dipstick: NEGATIVE
Ketones, ur: 5 mg/dL — AB
Leukocytes,Ua: NEGATIVE
Nitrite: NEGATIVE
Protein, ur: 30 mg/dL — AB
Specific Gravity, Urine: 1.026 (ref 1.005–1.030)
pH: 6 (ref 5.0–8.0)

## 2023-05-11 LAB — HCG, QUANTITATIVE, PREGNANCY: hCG, Beta Chain, Quant, S: 49186 m[IU]/mL — ABNORMAL HIGH

## 2023-05-11 LAB — POCT PREGNANCY, URINE: Preg Test, Ur: POSITIVE — AB

## 2023-05-11 NOTE — MAU Provider Note (Signed)
 History     CSN: [id number]  Arrival date and time: 05/11/23 5621   Event Date/Time   First Provider Initiated Contact with Patient 05/11/23 1027      Chief Complaint  Patient presents with   Vaginal Bleeding   Abdominal Pain   Possible Pregnancy   HPI  Mary Ware is a 28 y.o. G2P1001 at [redacted]w[redacted]d who presents for evaluation of lower abdominal cramping. Patient reports she has been cramping for the last 2 days and even had to leave work because of the pain. Today, the pain is mild and not as bad as it was. Patient rates the pain as a 4/10 and has not tried anything for the pain. She reports one instance of pink spotting with wiping.  She denies any discharge and leaking of fluid. Denies any constipation, diarrhea or any urinary complaints. OB History     Gravida  2   Para  1   Term  1   Preterm      AB      Living  1      SAB      IAB      Ectopic      Multiple      Live Births  1           Past Medical History:  Diagnosis Date   GERD (gastroesophageal reflux disease)    Heart murmur    h/o r/t pulmonary valve stenosis   Intractable vomiting    Kidney stones    Pulmonary valve stenosis    AS A CHILD-PT HAS OUTGROWN AND REVERSED ITSELF PER PT AND DOES NOT SEE A CARDIOLOGIST-ASYMPTOMATIC    Past Surgical History:  Procedure Laterality Date   CHOLECYSTECTOMY N/A 04/27/2015   Procedure: LAPAROSCOPIC CHOLECYSTECTOMY WITH INTRAOPERATIVE CHOLANGIOGRAM;  Surgeon: Nadeen Landau, MD;  Location: [facility] ORS;  Service: General;  Laterality: N/A;   WISDOM TOOTH EXTRACTION      Family History  Problem Relation Age of Onset   Cancer Mother        lung/mets to brain   COPD Mother    Hypertension Mother    Diabetes Mother    Emphysema Mother    Other Father        unknonwn, doesn't go    Social History   Tobacco Use   Smoking status: Former    Current packs/day: 0.50    Types: Cigars, Cigarettes   Smokeless tobacco: Never   Tobacco  comments:    Quit Feb 2025  Vaping Use   Vaping status: Never Used  Substance Use Topics   Alcohol use: No   Drug use: Not Currently    Types: Heroin, Benzodiazepines, Marijuana    Comment: + UDS in 2016 for THC (tetrahydrocannabinol);nothing since +preg test 2025    Allergies:  Allergies  Allergen Reactions   Coconut Fatty Acid Anaphylaxis    No medications prior to admission.    Review of Systems  Constitutional: Negative.  Negative for fatigue and fever.  HENT: Negative.    Respiratory: Negative.  Negative for shortness of breath.   Cardiovascular: Negative.  Negative for chest pain.  Gastrointestinal: Negative.  Negative for abdominal pain, constipation, diarrhea, nausea and vomiting.  Genitourinary: Negative.  Negative for dysuria.  Neurological: Negative.  Negative for dizziness and headaches.   Physical Exam   Blood pressure 105/60, pulse 65, temperature 98.4 F (36.9 C), temperature source Oral, resp. rate 15, height 5' (1.524 m), weight 48.3 kg, last menstrual period  03/29/2023, SpO2 100%.  Patient Vitals for the past 24 hrs:  BP Temp Temp src Pulse Resp SpO2 Height Weight  05/11/23 0955 105/60 98.4 F (36.9 C) Oral 65 15 100 % 5' (1.524 m) 48.3 kg    Physical Exam Vitals and nursing note reviewed.  Constitutional:      General: She is not in acute distress.    Appearance: She is well-developed.  HENT:     Head: Normocephalic.  Eyes:     Pupils: Pupils are equal, round, and reactive to light.  Cardiovascular:     Rate and Rhythm: Normal rate and regular rhythm.     Heart sounds: Normal heart sounds.  Pulmonary:     Effort: Pulmonary effort is normal. No respiratory distress.     Breath sounds: Normal breath sounds.  Abdominal:     General: Bowel sounds are normal. There is no distension.     Palpations: Abdomen is soft.     Tenderness: There is no abdominal tenderness.  Skin:    General: Skin is warm and dry.  Neurological:     Mental Status: She  is alert and oriented to person, place, and time.  Psychiatric:        Mood and Affect: Mood normal.        Behavior: Behavior normal.        Thought Content: Thought content normal.        Judgment: Judgment normal.    MAU Course  Procedures  Results for orders placed or performed during the hospital encounter of 05/11/23 (from the past 24 hours)  Urinalysis, Routine w reflex microscopic -Urine, Clean Catch     Status: Abnormal   Collection Time: 05/11/23 10:19 AM  Result Value Ref Range   Color, Urine AMBER (A) YELLOW   APPearance CLOUDY (A) CLEAR   Specific Gravity, Urine 1.026 1.005 - 1.030   pH 6.0 5.0 - 8.0   Glucose, UA NEGATIVE NEGATIVE mg/dL   Hgb urine dipstick NEGATIVE NEGATIVE   Bilirubin Urine NEGATIVE NEGATIVE   Ketones, ur 5 (A) NEGATIVE mg/dL   Protein, ur 30 (A) NEGATIVE mg/dL   Nitrite NEGATIVE NEGATIVE   Leukocytes,Ua NEGATIVE NEGATIVE   RBC / HPF 0-5 0 - 5 RBC/hpf   WBC, UA 0-5 0 - 5 WBC/hpf   Bacteria, UA RARE (A) NONE SEEN   Squamous Epithelial / HPF 6-10 0 - 5 /HPF   Mucus PRESENT   Wet prep, genital     Status: Abnormal   Collection Time: 05/11/23 10:19 AM   Specimen: PATH Cytology Cervicovaginal Ancillary Only  Result Value Ref Range   Yeast Wet Prep HPF POC NONE SEEN NONE SEEN   Trich, Wet Prep NONE SEEN NONE SEEN   Clue Cells Wet Prep HPF POC PRESENT (A) NONE SEEN   WBC, Wet Prep HPF POC <10 <10   Sperm NONE SEEN   Pregnancy, urine POC     Status: Abnormal   Collection Time: 05/11/23 10:23 AM  Result Value Ref Range   Preg Test, Ur POSITIVE (A) NEGATIVE  CBC     Status: None   Collection Time: 05/11/23 10:34 AM  Result Value Ref Range   WBC 10.1 4.0 - 10.5 K/uL   RBC 4.28 3.87 - 5.11 MIL/uL   Hemoglobin 13.5 12.0 - 15.0 g/dL   HCT 56.2 13.0 - 86.5 %   MCV 92.1 80.0 - 100.0 fL   MCH 31.5 26.0 - 34.0 pg   MCHC 34.3 30.0 - 36.0  g/dL   RDW 96.0 45.4 - 09.8 %   Platelets 260 150 - 400 K/uL   nRBC 0.0 0.0 - 0.2 %  hCG, quantitative,  pregnancy     Status: Abnormal   Collection Time: 05/11/23 10:34 AM  Result Value Ref Range   hCG, Beta Chain, Quant, S 49,186 (H) <5 mIU/mL     US OB LESS THAN 14 WEEKS WITH OB TRANSVAGINAL Result Date: 05/11/2023 CLINICAL DATA:  Abdominal pain.  First trimester pregnancy. EXAM: OBSTETRIC <14 WK [place] AND TRANSVAGINAL OB US TECHNIQUE: Both transabdominal and transvaginal ultrasound examinations were performed for complete evaluation of the gestation as well as the maternal uterus, adnexal regions, and pelvic cul-de-sac. Transvaginal technique was performed to assess early pregnancy. COMPARISON:  None for this pregnancy FINDINGS: Intrauterine gestational sac: Present Yolk sac:  Present Embryo:  Visualized Cardiac Activity: Visualized Heart Rate: Unable to measure by M-mode. Cardiac activity is visualized. CRL:  2.1 mm   5 w   5 d                  [place] EDC: 01/06/2024 Subchorionic hemorrhage: A small focus of subchorionic hemorrhage is noted. Maternal uterus/adnexae: Within normal limits. A small amount of free fluid is present. IMPRESSION: 1. Single live intrauterine pregnancy with estimated gestational age of [redacted] weeks and 5 days. 2. Small subchorionic hemorrhage.  No other complicating features. Electronically Signed   By: Marin Roberts M.D.   On: 05/11/2023 11:53     MDM Labs ordered and reviewed.   UA, UPT CBC, HCG ABO/Rh- A Pos Wet prep and gc/chlamydia US OB Comp Less 14 weeks with Transvaginal  CNM independently reviewed the imaging ordered. Imaging show gestational sac with early fetal pole consistent with dates.   Patient has appointment with [facility] in 2 weeks for viability ultrasound. Encouraged patient to keep appointment.   Assessment and Plan   1. Normal intrauterine pregnancy on prenatal ultrasound in first trimester   2. [redacted] weeks gestation of pregnancy   3. Subchorionic hemorrhage of placenta in first trimester     -Discharge home in stable condition -First trimester  precautions discussed -Patient advised to follow-up with OB as scheduled for prenatal care -Patient may return to MAU as needed or if her condition were to change or worsen  Rolm Bookbinder, CNM 05/11/2023, 1:21 PM

## 2023-05-11 NOTE — MAU Note (Signed)
 Mary Ware is a 28 y.o. at Unknown here in MAU reporting: for the past 2 days has been having cramping in lower part of stomach.  Today she actually left work due to the pain.  Also noted some spotting, pinkish red, very light. No clots.  Denies recent intercourse.  +HPT, has not been confirmed LMP: 2/22 Onset of complaint: yesterday Pain score: mild  Vitals:   05/11/23 0955  BP: 105/60  Pulse: 65  Resp: 15  Temp: 98.4 F (36.9 C)  SpO2: 100%      Lab orders placed from triage:  UPT/UA if + Vag swabs collected

## 2023-05-11 NOTE — Discharge Instructions (Signed)

## 2023-05-12 LAB — GC/CHLAMYDIA PROBE AMP (~~LOC~~) NOT AT ARMC
Chlamydia: NEGATIVE
Comment: NEGATIVE
Comment: NORMAL
Neisseria Gonorrhea: NEGATIVE

## 2023-05-18 ENCOUNTER — Other Ambulatory Visit: Payer: Self-pay

## 2023-05-18 ENCOUNTER — Encounter (HOSPITAL_COMMUNITY): Payer: Self-pay | Admitting: Emergency Medicine

## 2023-05-18 ENCOUNTER — Emergency Department (HOSPITAL_COMMUNITY)
Admission: EM | Admit: 2023-05-18 | Discharge: 2023-05-18 | Disposition: A | Discharge status: Home / Self Care | Attending: Emergency Medicine | Admitting: Emergency Medicine

## 2023-05-18 DIAGNOSIS — R059 Cough, unspecified: Secondary | ICD-10-CM | POA: Diagnosis not present

## 2023-05-18 DIAGNOSIS — Z3A01 Less than 8 weeks gestation of pregnancy: Secondary | ICD-10-CM | POA: Insufficient documentation

## 2023-05-18 DIAGNOSIS — R112 Nausea with vomiting, unspecified: Secondary | ICD-10-CM

## 2023-05-18 DIAGNOSIS — R0981 Nasal congestion: Secondary | ICD-10-CM | POA: Diagnosis not present

## 2023-05-18 DIAGNOSIS — R1013 Epigastric pain: Secondary | ICD-10-CM | POA: Insufficient documentation

## 2023-05-18 DIAGNOSIS — R509 Fever, unspecified: Secondary | ICD-10-CM | POA: Diagnosis not present

## 2023-05-18 DIAGNOSIS — O219 Vomiting of pregnancy, unspecified: Secondary | ICD-10-CM | POA: Diagnosis not present

## 2023-05-18 DIAGNOSIS — O26891 Other specified pregnancy related conditions, first trimester: Secondary | ICD-10-CM | POA: Diagnosis not present

## 2023-05-18 LAB — COMPREHENSIVE METABOLIC PANEL WITH GFR
ALT: 10 U/L (ref 0–44)
AST: 18 U/L (ref 15–41)
Albumin: 3.8 g/dL (ref 3.5–5.0)
Alkaline Phosphatase: 42 U/L (ref 38–126)
Anion gap: 8 (ref 5–15)
BUN: 6 mg/dL (ref 6–20)
CO2: 19 mmol/L — ABNORMAL LOW (ref 22–32)
Calcium: 9.2 mg/dL (ref 8.9–10.3)
Chloride: 105 mmol/L (ref 98–111)
Creatinine, Ser: <0.3 mg/dL — ABNORMAL LOW (ref 0.44–1.00)
Glucose, Bld: 105 mg/dL — ABNORMAL HIGH (ref 70–99)
Potassium: 3.3 mmol/L — ABNORMAL LOW (ref 3.5–5.1)
Sodium: 132 mmol/L — ABNORMAL LOW (ref 135–145)
Total Bilirubin: 1 mg/dL (ref 0.0–1.2)
Total Protein: 6.5 g/dL (ref 6.5–8.1)

## 2023-05-18 LAB — CBC WITH DIFFERENTIAL/PLATELET
Abs Immature Granulocytes: 0.12 10*3/uL — ABNORMAL HIGH (ref 0.00–0.07)
Basophils Absolute: 0 10*3/uL (ref 0.0–0.1)
Basophils Relative: 0 %
Eosinophils Absolute: 0 10*3/uL (ref 0.0–0.5)
Eosinophils Relative: 0 %
HCT: 39.8 % (ref 36.0–46.0)
Hemoglobin: 14.1 g/dL (ref 12.0–15.0)
Immature Granulocytes: 1 %
Lymphocytes Relative: 4 %
Lymphs Abs: 0.7 10*3/uL (ref 0.7–4.0)
MCH: 32.4 pg (ref 26.0–34.0)
MCHC: 35.4 g/dL (ref 30.0–36.0)
MCV: 91.5 fL (ref 80.0–100.0)
Monocytes Absolute: 0.6 10*3/uL (ref 0.1–1.0)
Monocytes Relative: 4 %
Neutro Abs: 15.7 10*3/uL — ABNORMAL HIGH (ref 1.7–7.7)
Neutrophils Relative %: 91 %
Platelets: 231 10*3/uL (ref 150–400)
RBC: 4.35 MIL/uL (ref 3.87–5.11)
RDW: 11.7 % (ref 11.5–15.5)
WBC: 17.2 10*3/uL — ABNORMAL HIGH (ref 4.0–10.5)
nRBC: 0 % (ref 0.0–0.2)

## 2023-05-18 LAB — RESP PANEL BY RT-PCR (RSV, FLU A&B, COVID)  RVPGX2
Influenza A by PCR: NEGATIVE
Influenza B by PCR: NEGATIVE
Resp Syncytial Virus by PCR: NEGATIVE
SARS Coronavirus 2 by RT PCR: NEGATIVE

## 2023-05-18 LAB — HCG, QUANTITATIVE, PREGNANCY: hCG, Beta Chain, Quant, S: 153983 m[IU]/mL — ABNORMAL HIGH (ref <5)

## 2023-05-18 LAB — LIPASE, BLOOD: Lipase: 32 U/L (ref 11–51)

## 2023-05-18 MED ORDER — LORAZEPAM 2 MG/ML IJ SOLN: 0.5000 mg | Freq: Once | INTRAMUSCULAR | Status: DC

## 2023-05-18 MED ORDER — DOXYLAMINE-PYRIDOXINE 10-10 MG PO TBEC
1.0000 | DELAYED_RELEASE_TABLET | Freq: Three times a day (TID) | ORAL | 0 refills | Status: DC | PRN
Start: 2023-05-18 — End: 2023-09-19

## 2023-05-18 MED ORDER — METOCLOPRAMIDE HCL 5 MG/ML IJ SOLN
10.0000 mg | Freq: Once | INTRAMUSCULAR | Status: AC
  Administered 2023-05-18: 10 mg via INTRAVENOUS
  Filled 2023-05-18: qty 2

## 2023-05-18 MED ORDER — SODIUM CHLORIDE 0.9 % IV BOLUS
1000.0000 mL | Freq: Once | INTRAVENOUS | Status: AC
  Administered 2023-05-18: 1000 mL via INTRAVENOUS

## 2023-05-18 MED ORDER — ONDANSETRON 4 MG PO TBDP
4.0000 mg | ORAL_TABLET | Freq: Once | ORAL | Status: AC
  Administered 2023-05-18: 4 mg via ORAL
  Filled 2023-05-18: qty 1

## 2023-05-18 MED ORDER — ONDANSETRON HCL 4 MG/2ML IJ SOLN
4.0000 mg | Freq: Once | INTRAMUSCULAR | Status: AC
  Administered 2023-05-18: 4 mg via INTRAVENOUS
  Filled 2023-05-18: qty 2

## 2023-05-18 NOTE — Discharge Instructions (Addendum)
 You have symptoms likely due to pregnancy induced nausea and vomiting.  You may take medication prescribed as needed for your nausea.  Follow-up closely with your OB/GYN for outpatient management of your pregnancy.  Return if you have any concern.

## 2023-05-18 NOTE — ED Provider Notes (Signed)
 [place] EMERGENCY DEPARTMENT AT [facility] Provider Note   CSN: [id number] Arrival date & time: 05/18/23  9562     History  Chief Complaint  Patient presents with   Cough   Emesis    Krisinda L Brandenburger is a 28 y.o. female.  The history is provided by the patient and medical records. No language interpreter was used.  Cough Emesis Associated symptoms: cough      28 year old female who is currently 7 weeks and 1 day pregnant, history of GERD, kidney stone, intractable nausea and vomiting, chronic abdominal pain, pancreatitis presenting with cold symptoms.  Patient report for the past 2 days she has had chills, body aches, congestion, sore throat, cough.  Patient also have been vomiting throughout the day today.  She has abdominal pain but this is chronic.  She admits to marijuana use but states she stopped using it 3 weeks ago when she found out that she is pregnant.  She is a G2, P1.  She does not complain of any vaginal bleeding or vaginal discharge.  No specific treatment tried.  She mention she was evaluated a week ago with an ultrasound and was told that she has some subchorionic hemorrhage.  Her bleeding has stopped. . Home Medications Prior to Admission medications   Medication Sig Start Date End Date Taking? Authorizing Provider  ondansetron (ZOFRAN-ODT) 8 MG disintegrating tablet Take 1 tablet (8 mg total) by mouth every 8 (eight) hours as needed for nausea or vomiting. 02/04/23   Guadalupe Lee, MD      Allergies    Coconut fatty acid    Review of Systems   Review of Systems  Respiratory:  Positive for cough.   Gastrointestinal:  Positive for vomiting.  All other systems reviewed and are negative.   Physical Exam Updated Vital Signs BP (!) 97/47 (BP Location: Right Arm)   Pulse 84   Temp 98 F (36.7 C)   Resp 15   Ht 5' (1.524 m)   Wt 49 kg   LMP 03/29/2023   SpO2 100%   BMI 21.09 kg/m  Physical Exam Vitals and nursing note reviewed.   Constitutional:      Appearance: She is well-developed.     Comments: Patient is actively vomiting appears uncomfortable.  HENT:     Head: Atraumatic.     Mouth/Throat:     Mouth: Mucous membranes are moist.  Eyes:     Conjunctiva/sclera: Conjunctivae normal.  Cardiovascular:     Rate and Rhythm: Normal rate and regular rhythm.     Pulses: Normal pulses.     Heart sounds: Normal heart sounds.  Pulmonary:     Effort: Pulmonary effort is normal.  Abdominal:     Palpations: Abdomen is soft.     Tenderness: There is abdominal tenderness (Mild epigastric tenderness no guarding or rebound tenderness.).  Musculoskeletal:     Cervical back: Normal range of motion and neck supple. No rigidity.  Skin:    Findings: No rash.  Neurological:     Mental Status: She is alert.  Psychiatric:        Mood and Affect: Mood normal.     ED Results / Procedures / Treatments   Labs (all labs ordered are listed, but only abnormal results are displayed) Labs Reviewed  CBC WITH DIFFERENTIAL/PLATELET - Abnormal; Notable for the following components:      Result Value   WBC 17.2 (*)    Neutro Abs 15.7 (*)    Abs  Immature Granulocytes 0.12 (*)    All other components within normal limits  COMPREHENSIVE METABOLIC PANEL WITH GFR - Abnormal; Notable for the following components:   Sodium 132 (*)    Potassium 3.3 (*)    CO2 19 (*)    Glucose, Bld 105 (*)    Creatinine, Ser <0.30 (*)    All other components within normal limits  HCG, QUANTITATIVE, PREGNANCY - Abnormal; Notable for the following components:   hCG, Beta Chain, Quant, S [id number] (*)    All other components within normal limits  RESP PANEL BY RT-PCR (RSV, FLU A&B, COVID)  RVPGX2  LIPASE, BLOOD  URINALYSIS, ROUTINE W REFLEX MICROSCOPIC    EKG None  Radiology No results found.  Procedures Procedures    Medications Ordered in ED Medications  LORazepam (ATIVAN) injection 0.5 mg (has no administration in time range)   ondansetron (ZOFRAN-ODT) disintegrating tablet 4 mg (4 mg Oral Given 05/18/23 0823)  ondansetron (ZOFRAN) injection 4 mg (4 mg Intravenous Given 05/18/23 0913)  sodium chloride 0.9 % bolus 1,000 mL (0 mLs Intravenous Stopped 05/18/23 1034)  metoCLOPramide (REGLAN) injection 10 mg (10 mg Intravenous Given 05/18/23 1117)    ED Course/ Medical Decision Making/ A&P                                 Medical Decision Making Amount and/or Complexity of Data Reviewed Labs: ordered.  Risk Prescription drug management.   BP (!) 97/47 (BP Location: Right Arm)   Pulse 84   Temp 98 F (36.7 C)   Resp 15   Ht 5' (1.524 m)   Wt 49 kg   LMP 03/29/2023   SpO2 100%   BMI 21.09 kg/m   93:59 AM  28 year old female who is currently 7 weeks and 1 day pregnant, history of GERD, kidney stone, intractable nausea and vomiting, chronic abdominal pain, pancreatitis presenting with cold symptoms.  Patient report for the past 2 days she has had chills, body aches, congestion, sore throat, cough.  Patient also have been vomiting throughout the day today.  She has abdominal pain but this is chronic.  She admits to marijuana use but states she stopped using it 3 weeks ago when she found out that she is pregnant.  She is a G2, P1.  She does not complain of any vaginal bleeding or vaginal discharge.  No specific treatment tried.  On exam, patient appears uncomfortable, actively vomiting.  She has some epigastric tenderness on palpation.  -Labs ordered, independently viewed and interpreted by me.  Labs remarkable for WBC 17.2, K+ 3.3, beta HCG 153k -The patient was maintained on a cardiac monitor.  I personally viewed and interpreted the cardiac monitored which showed an underlying rhythm of: sinus bradycardia -Imaging including CXR considered but lungs are clear, no hypoxia and sxs not suggestive of pneumonia.  Abd/pelvis CT considered but on reassessment she has a benign abdominal exam.   -This patient presents to  the ED for concern of cough, this involves an extensive number of treatment options, and is a complaint that carries with it a high risk of complications and morbidity.  The differential diagnosis includes viral illness, pneumonia, gastritis, gerd, appendicitis, cholecystitis, pancreatitis, diverticulitis, ectopic pregnancy -Co morbidities that complicate the patient evaluation includes pregnancy, gerd, kidney stone, chronic abd pain -Treatment includes zofran, reglan, ivf,  -Reevaluation of the patient after these medicines showed that the patient improved -PCP office notes or  outside notes reviewed -Escalation to admission/observation considered: patients feels much better, is comfortable with discharge, and will follow up with PCP -Prescription medication considered, patient comfortable with diglegis for nausea -Social Determinant of Health considered which includes tobacco use  Although nausea did improve, patient became more restless and wanting husband to be at bedside.  I notified the staff that husband can stay with patient if he is here.  At this time patient request to be discharged.  She tolerates p.o. and did feel better.  Suspect hyperemesis gravidarum in the setting of pregnancy.  She will follow-up closely with her doctor for further care.  I gave return precaution.  Discharged home with Diclegis.        Final Clinical Impression(s) / ED Diagnoses Final diagnoses:  Nausea and vomiting, unspecified vomiting type    Rx / DC Orders ED Discharge Orders          Ordered    Doxylamine-Pyridoxine (DICLEGIS) 10-10 MG TBEC  3 times daily PRN        05/18/23 1306              Debbra Fairy, PA-C 05/18/23 1308    Nora Beal, [place], DO 05/18/23 1511

## 2023-05-18 NOTE — ED Triage Notes (Signed)
 Patient comes states she has fever and chills since last night. Started coughing yesterday. Started vomiting this morning.  Abdominal pain but states it is chronic

## 2023-06-11 ENCOUNTER — Emergency Department (HOSPITAL_COMMUNITY)
Admission: EM | Admit: 2023-06-11 | Discharge: 2023-06-11 | Disposition: A | Discharge status: Home / Self Care | Attending: Emergency Medicine | Admitting: Emergency Medicine

## 2023-06-11 ENCOUNTER — Other Ambulatory Visit: Payer: Self-pay

## 2023-06-11 ENCOUNTER — Encounter (HOSPITAL_COMMUNITY): Payer: Self-pay

## 2023-06-11 DIAGNOSIS — S0300XA Dislocation of jaw, unspecified side, initial encounter: Secondary | ICD-10-CM | POA: Insufficient documentation

## 2023-06-11 DIAGNOSIS — X501XXA Overexertion from prolonged static or awkward postures, initial encounter: Secondary | ICD-10-CM | POA: Diagnosis not present

## 2023-06-11 DIAGNOSIS — Z3A01 Less than 8 weeks gestation of pregnancy: Secondary | ICD-10-CM | POA: Diagnosis not present

## 2023-06-11 DIAGNOSIS — O210 Mild hyperemesis gravidarum: Secondary | ICD-10-CM | POA: Diagnosis not present

## 2023-06-11 DIAGNOSIS — O9A211 Injury, poisoning and certain other consequences of external causes complicating pregnancy, first trimester: Secondary | ICD-10-CM | POA: Insufficient documentation

## 2023-06-11 DIAGNOSIS — O99111 Other diseases of the blood and blood-forming organs and certain disorders involving the immune mechanism complicating pregnancy, first trimester: Secondary | ICD-10-CM | POA: Diagnosis not present

## 2023-06-11 DIAGNOSIS — D72829 Elevated white blood cell count, unspecified: Secondary | ICD-10-CM | POA: Insufficient documentation

## 2023-06-11 LAB — COMPREHENSIVE METABOLIC PANEL WITH GFR
ALT: 14 U/L (ref 0–44)
AST: 22 U/L (ref 15–41)
Albumin: 3.9 g/dL (ref 3.5–5.0)
Alkaline Phosphatase: 37 U/L — ABNORMAL LOW (ref 38–126)
Anion gap: 8 (ref 5–15)
BUN: 5 mg/dL — ABNORMAL LOW (ref 6–20)
CO2: 23 mmol/L (ref 22–32)
Calcium: 10.2 mg/dL (ref 8.9–10.3)
Chloride: 106 mmol/L (ref 98–111)
Creatinine, Ser: 0.53 mg/dL (ref 0.44–1.00)
GFR, Estimated: >60 mL/min (ref >60)
Glucose, Bld: 108 mg/dL — ABNORMAL HIGH (ref 70–99)
Potassium: 3.2 mmol/L — ABNORMAL LOW (ref 3.5–5.1)
Sodium: 137 mmol/L (ref 135–145)
Total Bilirubin: 0.7 mg/dL (ref 0.0–1.2)
Total Protein: 6.7 g/dL (ref 6.5–8.1)

## 2023-06-11 LAB — CBC WITH DIFFERENTIAL/PLATELET
Abs Immature Granulocytes: 0.07 10*3/uL (ref 0.00–0.07)
Basophils Absolute: 0 10*3/uL (ref 0.0–0.1)
Basophils Relative: 0 %
Eosinophils Absolute: 0 10*3/uL (ref 0.0–0.5)
Eosinophils Relative: 0 %
HCT: 42 % (ref 36.0–46.0)
Hemoglobin: 14.6 g/dL (ref 12.0–15.0)
Immature Granulocytes: 1 %
Lymphocytes Relative: 10 %
Lymphs Abs: 1.3 10*3/uL (ref 0.7–4.0)
MCH: 32.6 pg (ref 26.0–34.0)
MCHC: 34.8 g/dL (ref 30.0–36.0)
MCV: 93.8 fL (ref 80.0–100.0)
Monocytes Absolute: 0.7 10*3/uL (ref 0.1–1.0)
Monocytes Relative: 5 %
Neutro Abs: 11.2 10*3/uL — ABNORMAL HIGH (ref 1.7–7.7)
Neutrophils Relative %: 84 %
Platelets: 249 10*3/uL (ref 150–400)
RBC: 4.48 MIL/uL (ref 3.87–5.11)
RDW: 12.7 % (ref 11.5–15.5)
WBC: 13.4 10*3/uL — ABNORMAL HIGH (ref 4.0–10.5)
nRBC: 0 % (ref 0.0–0.2)

## 2023-06-11 LAB — HCG, SERUM, QUALITATIVE: Preg, Serum: POSITIVE — AB

## 2023-06-11 MED ORDER — ONDANSETRON 4 MG PO TBDP
ORAL_TABLET | ORAL | Status: AC
  Administered 2023-06-11: 4 mg via ORAL
  Filled 2023-06-11: qty 1

## 2023-06-11 MED ORDER — METOCLOPRAMIDE HCL 10 MG PO TABS: 10.0000 mg | ORAL_TABLET | Freq: Four times a day (QID) | ORAL | 0 refills | Status: DC

## 2023-06-11 MED ORDER — DEXTROSE IN LACTATED RINGERS 5 % IV SOLN: INTRAVENOUS | Status: DC

## 2023-06-11 MED ORDER — DIPHENHYDRAMINE HCL 50 MG/ML IJ SOLN
25.0000 mg | Freq: Once | INTRAMUSCULAR | Status: AC
  Administered 2023-06-11: 25 mg via INTRAVENOUS
  Filled 2023-06-11: qty 1

## 2023-06-11 MED ORDER — ONDANSETRON 4 MG PO TBDP
4.0000 mg | ORAL_TABLET | Freq: Once | ORAL | Status: AC
  Administered 2023-06-11: 4 mg via ORAL
  Filled 2023-06-11: qty 1

## 2023-06-11 MED ORDER — METOCLOPRAMIDE HCL 5 MG/ML IJ SOLN
10.0000 mg | Freq: Once | INTRAMUSCULAR | Status: AC
  Administered 2023-06-11: 10 mg via INTRAVENOUS
  Filled 2023-06-11: qty 2

## 2023-06-11 NOTE — ED Provider Triage Note (Signed)
 Emergency Medicine Provider Triage Evaluation Note  Mary Ware , a 28 y.o. female  was evaluated in triage.  Pt complains of n/v, [redacted] weeks pregnant and scheduled to see OB today at 2. Vomited and her jaw locked today. Unable to open mouth. No history of similar problem previously. Pain if she tries to open mouth.  Review of Systems  Positive: N/v Negative: Abdominal pain  Physical Exam  Ht 5' (1.524 m)   Wt 48.5 kg   LMP 03/29/2023   BMI 20.90 kg/m  Gen:   Awake, no distress   Resp:  Normal effort  MSK:   Moves extremities without difficulty  Other:  +trismus   Medical Decision Making  Medically screening exam initiated at 12:36 PM.  Appropriate orders placed.  Fontella L Gerken was informed that the remainder of the evaluation will be completed by another provider, this initial triage assessment does not replace that evaluation, and the importance of remaining in the ED until their evaluation is complete.     Darlis Eisenmenger, PA-C 06/11/23 1237

## 2023-06-11 NOTE — ED Triage Notes (Addendum)
 Pt has been vomiting since 7am and states her jaw is now locked up. Denies fevers and diarrhea. Pt states she is [redacted] weeks pregnant.

## 2023-06-11 NOTE — Discharge Instructions (Addendum)
 Please follow-up with your obstetrician.  I prescribed you medication that can help you with your nausea.  I would have you take the Diclegis  first.  You are supposed to start with 1 tablet at night and then take it twice a day if you have ongoing nausea.  I think you dislocated your jaw.  Typically you go on a soft diet for a few days.  Try not to open your mouth really wide for any reason.  If you need to(ie yawning) try to support the bottom of your jaw with your hand.

## 2023-06-11 NOTE — ED Provider Notes (Signed)
 [place] EMERGENCY DEPARTMENT AT [facility] Provider Note   CSN: [id number] Arrival date & time: 06/11/23  1222     History  Chief Complaint  Patient presents with   Emesis    Mary Ware is a 28 y.o. female.  28 yo F with a chief complaints of nausea and vomiting.  The patient unfortunately has been struggling with nausea and vomiting this pregnancy.  And actually has been better than her last pregnancy.  Has been vomiting mostly in the mornings.  Has tried her medications without obvious improvement.  She was vomiting so forcefully that she felt like she had a pop in her Jaw and since that has been unable to open or close it.  Just before I came into the room she said she felt a pop again and she is now able to move it without issue.   Emesis      Home Medications Prior to Admission medications   Medication Sig Start Date End Date Taking? Authorizing Provider  metoCLOPramide  (REGLAN ) 10 MG tablet Take 1 tablet (10 mg total) by mouth every 6 (six) hours. 06/11/23  Yes Albertus Hughs, DO  Doxylamine -Pyridoxine  (DICLEGIS ) 10-10 MG TBEC Take 1 tablet by mouth 3 (three) times daily as needed (nausea). 05/18/23   Debbra Fairy, PA-C  ondansetron  (ZOFRAN -ODT) 8 MG disintegrating tablet Take 1 tablet (8 mg total) by mouth every 8 (eight) hours as needed for nausea or vomiting. 02/04/23   Guadalupe Lee, MD      Allergies    Coconut fatty acid    Review of Systems   Review of Systems  Gastrointestinal:  Positive for vomiting.    Physical Exam Updated Vital Signs BP (!) 120/57   Pulse 67   Temp 98.1 F (36.7 C) (Oral)   Resp 17   Ht 5' (1.524 m)   Wt 48.5 kg   LMP 03/29/2023   SpO2 100%   BMI 20.90 kg/m  Physical Exam Vitals and nursing note reviewed.  Constitutional:      General: She is not in acute distress.    Appearance: She is well-developed. She is not diaphoretic.  HENT:     Head: Normocephalic and atraumatic.  Eyes:     Pupils: Pupils are equal,  round, and reactive to light.  Cardiovascular:     Rate and Rhythm: Normal rate and regular rhythm.     Heart sounds: No murmur heard.    No friction rub. No gallop.  Pulmonary:     Effort: Pulmonary effort is normal.     Breath sounds: No wheezing or rales.  Abdominal:     General: There is no distension.     Palpations: Abdomen is soft.     Tenderness: There is no abdominal tenderness.  Musculoskeletal:        General: No tenderness.     Cervical back: Normal range of motion and neck supple.  Skin:    General: Skin is warm and dry.  Neurological:     Mental Status: She is alert and oriented to person, place, and time.  Psychiatric:        Behavior: Behavior normal.     ED Results / Procedures / Treatments   Labs (all labs ordered are listed, but only abnormal results are displayed) Labs Reviewed  CBC WITH DIFFERENTIAL/PLATELET - Abnormal; Notable for the following components:      Result Value   WBC 13.4 (*)    Neutro Abs 11.2 (*)  All other components within normal limits  COMPREHENSIVE METABOLIC PANEL WITH GFR - Abnormal; Notable for the following components:   Potassium 3.2 (*)    Glucose, Bld 108 (*)    BUN 5 (*)    Alkaline Phosphatase 37 (*)    All other components within normal limits  HCG, SERUM, QUALITATIVE - Abnormal; Notable for the following components:   Preg, Serum POSITIVE (*)    All other components within normal limits  URINALYSIS, ROUTINE W REFLEX MICROSCOPIC    EKG None  Radiology No results found.  Procedures Procedures    Medications Ordered in ED Medications  dextrose  5 % in lactated ringers  infusion (0 mLs Intravenous Stopped 06/11/23 1556)  ondansetron  (ZOFRAN -ODT) disintegrating tablet 4 mg (4 mg Oral Given 06/11/23 1331)  metoCLOPramide  (REGLAN ) injection 10 mg (10 mg Intravenous Given 06/11/23 1332)  diphenhydrAMINE  (BENADRYL ) injection 25 mg (25 mg Intravenous Given 06/11/23 1334)    ED Course/ Medical Decision Making/ A&P                                  Medical Decision Making Amount and/or Complexity of Data Reviewed Labs: ordered.  Risk Prescription drug management.   28 yo F with a chief complaint of intractable nausea and vomiting.  Hyperemesis gravidarum by history.  Sounds like she also had a dislocated jaw earlier but likely was able to reduce it on her own.  Will attempt to control nausea.  Lab work.  Bolus of IV fluids.  Reassess.  Mild leukocytosis.  No significant electrolyte abnormality.  Patient feeling better on repeat assessment.  Tolerated by mouth.  Would like to go home at this time.  Was unable to provide a urine sample for me.  4:08 PM:  I have discussed the diagnosis/risks/treatment options with the patient and family.  Evaluation and diagnostic testing in the emergency department does not suggest an emergent condition requiring admission or immediate intervention beyond what has been performed at this time.  They will follow up with OB. We also discussed returning to the ED immediately if new or worsening sx occur. We discussed the sx which are most concerning (e.g., sudden worsening pain, fever, inability to tolerate by mouth) that necessitate immediate return. Medications administered to the patient during their visit and any new prescriptions provided to the patient are listed below.  Medications given during this visit Medications  dextrose  5 % in lactated ringers  infusion (0 mLs Intravenous Stopped 06/11/23 1556)  ondansetron  (ZOFRAN -ODT) disintegrating tablet 4 mg (4 mg Oral Given 06/11/23 1331)  metoCLOPramide  (REGLAN ) injection 10 mg (10 mg Intravenous Given 06/11/23 1332)  diphenhydrAMINE  (BENADRYL ) injection 25 mg (25 mg Intravenous Given 06/11/23 1334)     The patient appears reasonably screen and/or stabilized for discharge and I doubt any other medical condition or other [facility] requiring further screening, evaluation, or treatment in the ED at this time prior to discharge.           Final Clinical Impression(s) / ED Diagnoses Final diagnoses:  Hyperemesis gravidarum  Jaw dislocation, initial encounter    Rx / DC Orders ED Discharge Orders          Ordered    metoCLOPramide  (REGLAN ) 10 MG tablet  Every 6 hours        06/11/23 1546              Albertus Hughs, DO 06/11/23 1608

## 2023-06-27 ENCOUNTER — Telehealth: Payer: Self-pay | Admitting: Obstetrics and Gynecology

## 2023-06-27 ENCOUNTER — Telehealth

## 2023-06-27 DIAGNOSIS — Z3A12 12 weeks gestation of pregnancy: Secondary | ICD-10-CM | POA: Diagnosis not present

## 2023-06-27 DIAGNOSIS — Z01419 Encounter for gynecological examination (general) (routine) without abnormal findings: Secondary | ICD-10-CM | POA: Diagnosis not present

## 2023-06-27 DIAGNOSIS — N912 Amenorrhea, unspecified: Secondary | ICD-10-CM | POA: Diagnosis not present

## 2023-06-27 DIAGNOSIS — Z331 Pregnant state, incidental: Secondary | ICD-10-CM | POA: Diagnosis not present

## 2023-06-27 DIAGNOSIS — Z369 Encounter for antenatal screening, unspecified: Secondary | ICD-10-CM | POA: Diagnosis not present

## 2023-06-27 DIAGNOSIS — Z113 Encounter for screening for infections with a predominantly sexual mode of transmission: Secondary | ICD-10-CM | POA: Diagnosis not present

## 2023-06-27 LAB — HEPATITIS C ANTIBODY: HCV Ab: NEGATIVE

## 2023-06-27 LAB — OB RESULTS CONSOLE GC/CHLAMYDIA
Chlamydia: NEGATIVE
Neisseria Gonorrhea: NEGATIVE

## 2023-06-27 LAB — OB RESULTS CONSOLE HEPATITIS B SURFACE ANTIGEN: Hepatitis B Surface Ag: NEGATIVE

## 2023-06-27 LAB — OB RESULTS CONSOLE HIV ANTIBODY (ROUTINE TESTING): HIV: NONREACTIVE

## 2023-06-27 LAB — OB RESULTS CONSOLE RUBELLA ANTIBODY, IGM: Rubella: IMMUNE

## 2023-06-27 NOTE — Telephone Encounter (Signed)
 Reached out to pt to reschedule [facility] Nurse Intake appt (via MyChart) that was scheduled on 06/27/2023 at 10:15.  Phone rang once and went to voicemail.  Left message for pt to call back to reschedule.

## 2023-07-01 ENCOUNTER — Encounter: Payer: Self-pay | Admitting: Obstetrics and Gynecology

## 2023-07-01 NOTE — Telephone Encounter (Signed)
 Reached out to pt (2x) to reschedule [facility] Nurse Intake appt (via MyChart) that was scheduled on 06/27/2023 at 10:15.  Phone went straight to voicemail.  Left message for pt to call back to reschedule.  Will send a MyChart letter to pt.

## 2023-08-21 DIAGNOSIS — Z3A20 20 weeks gestation of pregnancy: Secondary | ICD-10-CM | POA: Diagnosis not present

## 2023-08-21 DIAGNOSIS — Z363 Encounter for antenatal screening for malformations: Secondary | ICD-10-CM | POA: Diagnosis not present

## 2023-09-17 DIAGNOSIS — O4400 Placenta previa specified as without hemorrhage, unspecified trimester: Secondary | ICD-10-CM | POA: Diagnosis not present

## 2023-09-19 ENCOUNTER — Encounter (HOSPITAL_COMMUNITY): Payer: Self-pay | Admitting: Emergency Medicine

## 2023-09-19 ENCOUNTER — Inpatient Hospital Stay (HOSPITAL_COMMUNITY)
Admission: EM | Admit: 2023-09-19 | Discharge: 2023-09-19 | Disposition: A | Discharge status: Home / Self Care | Source: Ambulatory Visit | Attending: Emergency Medicine | Admitting: Emergency Medicine

## 2023-09-19 ENCOUNTER — Other Ambulatory Visit: Payer: Self-pay

## 2023-09-19 DIAGNOSIS — I959 Hypotension, unspecified: Secondary | ICD-10-CM | POA: Diagnosis not present

## 2023-09-19 DIAGNOSIS — R001 Bradycardia, unspecified: Secondary | ICD-10-CM | POA: Diagnosis not present

## 2023-09-19 DIAGNOSIS — Z3A24 24 weeks gestation of pregnancy: Secondary | ICD-10-CM

## 2023-09-19 DIAGNOSIS — Z3A25 25 weeks gestation of pregnancy: Secondary | ICD-10-CM | POA: Insufficient documentation

## 2023-09-19 DIAGNOSIS — R112 Nausea with vomiting, unspecified: Secondary | ICD-10-CM

## 2023-09-19 DIAGNOSIS — Z3490 Encounter for supervision of normal pregnancy, unspecified, unspecified trimester: Secondary | ICD-10-CM | POA: Diagnosis present

## 2023-09-19 DIAGNOSIS — O219 Vomiting of pregnancy, unspecified: Secondary | ICD-10-CM | POA: Insufficient documentation

## 2023-09-19 DIAGNOSIS — O26892 Other specified pregnancy related conditions, second trimester: Secondary | ICD-10-CM

## 2023-09-19 DIAGNOSIS — Z743 Need for continuous supervision: Secondary | ICD-10-CM | POA: Diagnosis not present

## 2023-09-19 DIAGNOSIS — Z3A14 14 weeks gestation of pregnancy: Secondary | ICD-10-CM | POA: Diagnosis not present

## 2023-09-19 DIAGNOSIS — R110 Nausea: Secondary | ICD-10-CM | POA: Diagnosis not present

## 2023-09-19 LAB — CBC WITH DIFFERENTIAL/PLATELET
Abs Immature Granulocytes: 0.14 K/uL — ABNORMAL HIGH (ref 0.00–0.07)
Basophils Absolute: 0.1 K/uL (ref 0.0–0.1)
Basophils Relative: 0 %
Eosinophils Absolute: 0.1 K/uL (ref 0.0–0.5)
Eosinophils Relative: 1 %
HCT: 39.4 % (ref 36.0–46.0)
Hemoglobin: 14.1 g/dL (ref 12.0–15.0)
Immature Granulocytes: 1 %
Lymphocytes Relative: 10 %
Lymphs Abs: 1.5 K/uL (ref 0.7–4.0)
MCH: 33.9 pg (ref 26.0–34.0)
MCHC: 35.8 g/dL (ref 30.0–36.0)
MCV: 94.7 fL (ref 80.0–100.0)
Monocytes Absolute: 0.7 K/uL (ref 0.1–1.0)
Monocytes Relative: 5 %
Neutro Abs: 13.1 K/uL — ABNORMAL HIGH (ref 1.7–7.7)
Neutrophils Relative %: 83 %
Platelets: 224 K/uL (ref 150–400)
RBC: 4.16 MIL/uL (ref 3.87–5.11)
RDW: 12.1 % (ref 11.5–15.5)
WBC: 15.7 K/uL — ABNORMAL HIGH (ref 4.0–10.5)
nRBC: 0 % (ref 0.0–0.2)

## 2023-09-19 LAB — BASIC METABOLIC PANEL WITH GFR
Anion gap: 11 (ref 5–15)
BUN: 5 mg/dL — ABNORMAL LOW (ref 6–20)
CO2: 17 mmol/L — ABNORMAL LOW (ref 22–32)
Calcium: 9.2 mg/dL (ref 8.9–10.3)
Chloride: 108 mmol/L (ref 98–111)
Creatinine, Ser: 0.39 mg/dL — ABNORMAL LOW (ref 0.44–1.00)
GFR, Estimated: >60 mL/min (ref >60)
Glucose, Bld: 106 mg/dL — ABNORMAL HIGH (ref 70–99)
Potassium: 3.6 mmol/L (ref 3.5–5.1)
Sodium: 136 mmol/L (ref 135–145)

## 2023-09-19 MED ORDER — HALOPERIDOL LACTATE 5 MG/ML IJ SOLN
2.0000 mg | Freq: Once | INTRAMUSCULAR | Status: AC
  Administered 2023-09-19: 2 mg via INTRAVENOUS
  Filled 2023-09-19: qty 1

## 2023-09-19 MED ORDER — SODIUM CHLORIDE 0.9 % IV SOLN
25.0000 mg | Freq: Once | INTRAVENOUS | Status: AC
  Filled 2023-09-19: qty 1

## 2023-09-19 MED ORDER — SODIUM CHLORIDE 0.9 % IV BOLUS
1000.0000 mL | Freq: Once | INTRAVENOUS | Status: AC
  Administered 2023-09-19: 1000 mL via INTRAVENOUS

## 2023-09-19 MED ORDER — ONDANSETRON HCL 4 MG/2ML IJ SOLN
4.0000 mg | Freq: Once | INTRAMUSCULAR | Status: AC
  Administered 2023-09-19: 4 mg via INTRAVENOUS
  Filled 2023-09-19: qty 2

## 2023-09-19 MED ORDER — LACTATED RINGERS IV BOLUS: 1000.0000 mL | Freq: Once | INTRAVENOUS | Status: DC

## 2023-09-19 NOTE — Progress Notes (Addendum)
 [facility] called to remotely monitor pt who is G2P1 at 24.[redacted]wks pregnant who presented to [facility] complaining of abdominal pain, N/V since 0400.  She also reports that she has not felt the baby move since yesterday.  She denies LOF or vaginal bleeding.  Nurses at [facility] having a difficult time picking up fhr but do get a reading of 135bpm.

## 2023-09-19 NOTE — Progress Notes (Signed)
 Rn to bedside because patient was yelling. Pt states I want to go home, they are trying to take my baby. RN reassured her no one was trying to take baby. RN attempt to deescalate. RN asked patient to be cautious with her IV so she doesn't hurt herself. MD to bedside. PT restated her desire to go home. MD instructed RN to take IV out and allow patient to discharge. IV removed, monitors removed. MD asked patient to confirm that she understands medical advice is to stay and complete the assessment of the patient and the baby. PT stated she understood but still wished to go home. PT left without signing any forms. PT accompanied by husband.   Maurilio LITTIE Hover 5:00 PM

## 2023-09-19 NOTE — ED Triage Notes (Addendum)
 Pt to the ED with complaints of Abdominal pain, N/V that began at 0400 this morning.  Pt reports being almost [redacted] weeks pregnant.  Pt is actively vomiting in triage.  Pt denies vaginal bleeding.

## 2023-09-19 NOTE — ED Notes (Signed)
 Per Rocky [facility] @ [facility] pt needs to come [facility] for more evaluation. EDP made aware. accepting doc @ [facility] is  Jon Rummer MD. [facility] has been made aware of transfer. Will f/u

## 2023-09-19 NOTE — Progress Notes (Signed)
 Zelda Salmon Emily,rn called and asked to hold fhr monitor on patient for a couple of minutes in order to get tracing.  She reports that pt is still throwing up all over the bed, but she will attempt to find fhr.

## 2023-09-19 NOTE — Progress Notes (Signed)
 Dr Henry notified of pt and difficulty that [facility] is having tracing fhr.  She says that she is alright with just the two minutes of tracing for now since pt is only [redacted]wks pregnant.  She says that she would like for them to trace fhr again for a couple minutes once she has been treated.  At that time, pt may be OB cleared.

## 2023-09-19 NOTE — Progress Notes (Signed)
 Dr Henry called back to say that pt may have some phenergan  before she leaves with [facility].  [facility] made aware.

## 2023-09-19 NOTE — ED Provider Notes (Signed)
 [place] EMERGENCY DEPARTMENT AT [facility] Provider Note   CSN: [id number] Arrival date & time: 09/19/23  1235     Patient presents with: Abdominal Pain   Mary Ware is a 28 y.o. female.  She is [redacted] weeks pregnant and follows with [facility].  Started with lower abdominal pain nausea and vomiting this morning.  Vomited several times.  No blood in the vomitus.  No vaginal bleeding or gush of fluid.  She did not attempt to reach her OB team.  No fevers or chills.   The history is provided by the patient.  Abdominal Pain Pain location:  Suprapubic Pain quality: aching and cramping   Pain severity:  Moderate Duration:  1 day Timing:  Intermittent Progression:  Unchanged Chronicity:  New Relieved by:  None tried Associated symptoms: nausea and vomiting   Associated symptoms: no fever, no vaginal bleeding and no vaginal discharge        Prior to Admission medications   Medication Sig Start Date End Date Taking? Authorizing Provider  Doxylamine -Pyridoxine  (DICLEGIS ) 10-10 MG TBEC Take 1 tablet by mouth 3 (three) times daily as needed (nausea). 05/18/23   Nivia Colon, PA-C  metoCLOPramide  (REGLAN ) 10 MG tablet Take 1 tablet (10 mg total) by mouth every 6 (six) hours. 06/11/23   Emil Share, DO  ondansetron  (ZOFRAN -ODT) 8 MG disintegrating tablet Take 1 tablet (8 mg total) by mouth every 8 (eight) hours as needed for nausea or vomiting. 02/04/23   Bernard Drivers, MD    Allergies: Coconut fatty acid    Review of Systems  Constitutional:  Negative for fever.  Gastrointestinal:  Positive for abdominal pain, nausea and vomiting.  Genitourinary:  Negative for vaginal bleeding and vaginal discharge.    Updated Vital Signs BP 114/73   Pulse 62   Temp 97.9 F (36.6 C) (Oral)   Resp 17   Ht 5' (1.524 m)   Wt 55.3 kg   LMP 03/29/2023   SpO2 100%   BMI 23.83 kg/m   Physical Exam Vitals and nursing note reviewed.  Constitutional:      General: She is  not in acute distress.    Appearance: Normal appearance. She is well-developed.  HENT:     Head: Normocephalic and atraumatic.  Eyes:     Conjunctiva/sclera: Conjunctivae normal.  Cardiovascular:     Rate and Rhythm: Normal rate and regular rhythm.     Heart sounds: No murmur heard. Pulmonary:     Effort: Pulmonary effort is normal. No respiratory distress.     Breath sounds: Normal breath sounds. No stridor. No wheezing.  Abdominal:     Palpations: There is mass (gravid).     Tenderness: There is abdominal tenderness in the suprapubic area. There is no guarding or rebound.  Musculoskeletal:        General: No tenderness or deformity. Normal range of motion.     Cervical back: Neck supple.  Skin:    General: Skin is warm and dry.  Neurological:     General: No focal deficit present.     Mental Status: She is alert.     GCS: GCS eye subscore is 4. GCS verbal subscore is 5. GCS motor subscore is 6.     (all labs ordered are listed, but only abnormal results are displayed) Labs Reviewed  BASIC METABOLIC PANEL WITH GFR - Abnormal; Notable for the following components:      Result Value   CO2 17 (*)  Glucose, Bld 106 (*)    BUN 5 (*)    Creatinine, Ser 0.39 (*)    All other components within normal limits  CBC WITH DIFFERENTIAL/PLATELET - Abnormal; Notable for the following components:   WBC 15.7 (*)    Neutro Abs 13.1 (*)    Abs Immature Granulocytes 0.14 (*)    All other components within normal limits  WET PREP, GENITAL  URINALYSIS, ROUTINE W REFLEX MICROSCOPIC  GC/CHLAMYDIA PROBE AMP ([place]) NOT AT [facility]    EKG: None  Radiology: No results found.   Ultrasound ED OB Pelvic  Date/Time: 09/19/2023 1:07 PM  Performed by: Towana Ozell BROCKS, MD Authorized by: Towana Ozell BROCKS, MD   Procedure details:    Indications: pregnant with abdominal pain     Assess:  Fetal viability   Technique:  Transabdominal obstetric (HCG+) exam   Images: not archived     Uterine findings:    Single gestation: identified     Fetal heart rate: identified      Left ovary findings:    Left ovary:  Unable to visualize    Right ovary findings:     Right ovary:  Unable to visualize    Other findings:    Free pelvic fluid: not identified     Free peritoneal fluid: not identified      Medications Ordered in the ED  lactated ringers  bolus 1,000 mL (has no administration in time range)  ondansetron  (ZOFRAN ) injection 4 mg (4 mg Intravenous Given 09/19/23 1320)  sodium chloride  0.9 % bolus 1,000 mL (1,000 mLs Intravenous Bolus 09/19/23 1321)  promethazine  (PHENERGAN ) 25 mg in sodium chloride  0.9 % 50 mL IVPB (25 mg Intravenous Given by EMS 09/19/23 1600)  haloperidol  lactate (HALDOL ) injection 2 mg (2 mg Intravenous Given 09/19/23 1616)    Clinical Course as of 09/19/23 1652  Fri Sep 19, 2023  1311 OB was consulted and they are monitoring patient remotely.  She was placed on fetal monitor. [MB]  1400 Patient continues to vomit and they are having trouble getting good tracings.  She is underweight so they would like her transferred to MAU for further evaluation.  Accepted by Dr. Henry. [MB]  1434 OB team is recommending Phenergan  for nausea control. [MB]    Clinical Course User Index [MB] Towana Ozell BROCKS, MD                                 Medical Decision Making Amount and/or Complexity of Data Reviewed Labs: ordered.  Risk Prescription drug management. Decision regarding hospitalization.   This patient complains of nausea and vomiting abdominal pain; this involves an extensive number of treatment Options and is a complaint that carries with it a high risk of complications and morbidity. The differential includes hyperemesis gravidarum, cannabis hyperemesis, obstruction, gastroenteritis, active labor, metabolic derangement  I ordered, reviewed and interpreted labs, which included CBC with elevated white count, chemistries with low bicarb I  ordered medication IV fluids and nausea medication and reviewed PMP when indicated. I ordered imaging studies which included bedside ultrasound and I independently    visualized and interpreted imaging which showed IUP with positive cardiac activity Additional history obtained from patient's significant other Previous records obtained and reviewed in epic including recent OB notes I consulted OB and discussed lab and imaging findings and discussed disposition.  Cardiac monitoring reviewed, sinus rhythm Social determinants considered, no significant barriers Critical Interventions: None  After the interventions stated above, I reevaluated the patient and found still nauseous and retching Admission and further testing considered, she will be transferred by [facility] down to the MAU where they can continue to do fetal monitoring and manage.      Final diagnoses:  Nausea and vomiting, unspecified vomiting type  Intrauterine pregnancy    ED Discharge Orders     None          Towana Ozell BROCKS, MD 09/19/23 1655

## 2023-09-19 NOTE — MAU Provider Note (Signed)
 MAU Provider Note  Chief Complaint: Abdominal Pain   Event Date/Time   First Provider Initiated Contact with Patient 09/19/23 1627      SUBJECTIVE HPI: Mary Ware is a 28 y.o. G2P1001 at [redacted]w[redacted]d by LMP who presents to maternity admissions reporting N/V. Pregnancy c/b low lying placenta, hx of hallucinations. Receives [facility] with [facility].  Patient presents from [facility] ER. She presented for abdominal pain, N/V. She states N/V started overnight. Had N/V earlier in pregnancy but had resolved. Suprapubic region for pain. She denies vaginal discharge, VB. Denies substance use this pregnancy.  HPI  Past Medical History:  Diagnosis Date   GERD (gastroesophageal reflux disease)    Heart murmur    h/o r/t pulmonary valve stenosis   Intractable vomiting    Kidney stones    Pulmonary valve stenosis    AS A CHILD-PT HAS OUTGROWN AND REVERSED ITSELF PER PT AND DOES NOT SEE A CARDIOLOGIST-ASYMPTOMATIC   Past Surgical History:  Procedure Laterality Date   CHOLECYSTECTOMY N/A 04/27/2015   Procedure: LAPAROSCOPIC CHOLECYSTECTOMY WITH INTRAOPERATIVE CHOLANGIOGRAM;  Surgeon: Larinda Unknown Sharps, MD;  Location: [facility] ORS;  Service: General;  Laterality: N/A;   WISDOM TOOTH EXTRACTION     Social History   Socioeconomic History   Marital status: Married    Spouse name: Not on file   Number of children: Not on file   Years of education: Not on file   Highest education level: Not on file  Occupational History   Not on file  Tobacco Use   Smoking status: Former    Current packs/day: 0.50    Types: Cigars, Cigarettes   Smokeless tobacco: Never   Tobacco comments:    Quit Feb 2025  Vaping Use   Vaping status: Never Used  Substance and Sexual Activity   Alcohol use: No   Drug use: Not Currently    Types: Heroin, Benzodiazepines, Marijuana    Comment: + UDS in 2016 for THC (tetrahydrocannabinol);nothing since +preg test 2025   Sexual activity: Yes    Birth control/protection: Implant   Other Topics Concern   Not on file  Social History Narrative   Not on file   Social Drivers of Health   Financial Resource Strain: Not on file  Food Insecurity: Not on file  Transportation Needs: Not on file  Physical Activity: Not on file  Stress: Not on file  Social Connections: Not on file  Intimate Partner Violence: Not on file   No current facility-administered medications on file prior to encounter.   Current Outpatient Medications on File Prior to Encounter  Medication Sig Dispense Refill   Prenatal Vit-Fe Fumarate-FA (PRENATAL VITAMINS PO) Take 1 tablet by mouth daily.     Allergies  Allergen Reactions   Coconut Fatty Acid Anaphylaxis    ROS:  Pertinent positives/negatives listed above.  I have reviewed patient's Past Medical Hx, Surgical Hx, Family Hx, Social Hx, medications and allergies.   Physical Exam  Patient Vitals for the past 24 hrs:  BP Temp Temp src Pulse Resp SpO2 Height Weight  09/19/23 1645 -- -- -- (!) 48 -- 98 % -- --  09/19/23 1640 -- -- -- (!) 52 -- 97 % -- --  09/19/23 1635 -- -- -- (!) 50 -- 97 % -- --  09/19/23 1625 110/84 98 F (36.7 C) Oral (!) 45 16 99 % -- --  09/19/23 1315 103/81 -- -- 68 10 100 % -- --  09/19/23 1251 -- -- -- -- -- --  5' (1.524 m) 55.3 kg  09/19/23 1247 114/73 97.9 F (36.6 C) Oral 62 17 100 % -- --   Constitutional: Well-developed, well-nourished female. Alternatively fatigued and distressed Cardiovascular: normal rate Respiratory: normal effort GI: Abd soft, suprapubic tenderness, gravid. No rebound or guarding MS: Extremities nontender, no edema, normal ROM Neurologic: Alert and oriented x 4  GU: Neg CVAT  FHT:  Baseline 125, moderate variability, 10x10 accelerations present, no decelerations Contractions: irregular/irritability  LAB RESULTS Results for orders placed or performed during the hospital encounter of 09/19/23 (from the past 24 hours)  Basic metabolic panel     Status: Abnormal   Collection  Time: 09/19/23  1:15 PM  Result Value Ref Range   Sodium 136 135 - 145 mmol/L   Potassium 3.6 3.5 - 5.1 mmol/L   Chloride 108 98 - 111 mmol/L   CO2 17 (L) 22 - 32 mmol/L   Glucose, Bld 106 (H) 70 - 99 mg/dL   BUN 5 (L) 6 - 20 mg/dL   Creatinine, Ser 9.60 (L) 0.44 - 1.00 mg/dL   Calcium 9.2 8.9 - 89.6 mg/dL   GFR, Estimated >39 >39 mL/min   Anion gap 11 5 - 15  CBC with Differential     Status: Abnormal   Collection Time: 09/19/23  1:15 PM  Result Value Ref Range   WBC 15.7 (H) 4.0 - 10.5 K/uL   RBC 4.16 3.87 - 5.11 MIL/uL   Hemoglobin 14.1 12.0 - 15.0 g/dL   HCT 60.5 63.9 - 53.9 %   MCV 94.7 80.0 - 100.0 fL   MCH 33.9 26.0 - 34.0 pg   MCHC 35.8 30.0 - 36.0 g/dL   RDW 87.8 88.4 - 84.4 %   Platelets 224 150 - 400 K/uL   nRBC 0.0 0.0 - 0.2 %   Neutrophils Relative % 83 %   Neutro Abs 13.1 (H) 1.7 - 7.7 K/uL   Lymphocytes Relative 10 %   Lymphs Abs 1.5 0.7 - 4.0 K/uL   Monocytes Relative 5 %   Monocytes Absolute 0.7 0.1 - 1.0 K/uL   Eosinophils Relative 1 %   Eosinophils Absolute 0.1 0.0 - 0.5 K/uL   Basophils Relative 0 %   Basophils Absolute 0.1 0.0 - 0.1 K/uL   Immature Granulocytes 1 %   Abs Immature Granulocytes 0.14 (H) 0.00 - 0.07 K/uL       IMAGING No results found.  MAU Management/MDM: Orders Placed This Encounter  Procedures   Pelvic US    Wet prep, genital   Basic metabolic panel   CBC with Differential   Urinalysis, Routine w reflex microscopic -Urine, Clean Catch   Discharge patient    Meds ordered this encounter  Medications   ondansetron  (ZOFRAN ) injection 4 mg   sodium chloride  0.9 % bolus 1,000 mL   promethazine  (PHENERGAN ) 25 mg in sodium chloride  0.9 % 50 mL IVPB   haloperidol  lactate (HALDOL ) injection 2 mg   lactated ringers  bolus 1,000 mL     Available prenatal records reviewed.  Patient presents from [place] ER for N/V and abdominal pain at [redacted]w[redacted]d gestation. She was given zofran , phenergan , and LR there. Received a dose of Haldol   for continued nausea and 500 ml LR. I reviewed outside labs. Mild WBC of 15 with normal Hgb, Cr, LFTs. She has a benign abdomen, so I do not suspect an appendicitis, diverticulitis, etc. I do suspect possible UTI or vaginitis.  Patient called out wanting to leave and attempting to pull IV  out. Discussed that I recommended staying for UA, wet prep, GC/C swabs, and further fetal monitoring. She declines further evaluation at this time and expresses a continued desire to leave. As her and fetus are showing no signs of distress or need for emergent evaluation, she was discharged.  FWB: 10 minutes of tracing reactive for gestation. +FM.  ASSESSMENT 1. Nausea and vomiting, unspecified vomiting type   2. [redacted] weeks gestation of pregnancy     PLAN Discharge home with strict return precautions. Allergies as of 09/19/2023       Reactions   Coconut Fatty Acid Anaphylaxis        Medication List     TAKE these medications    PRENATAL VITAMINS PO Take 1 tablet by mouth daily.         Almarie Moats, MD OB Fellow 09/19/2023  5:13 PM

## 2023-09-19 NOTE — Progress Notes (Signed)
 Spoke with Dr Henry through secure chat.  [facility] is unable to assess fhr remotely because tracing is so spotty even with AP nurse holding monitor in place.  Also pt is continuously heaving so there is no way of knowing if her abdominal pain is UCs or not.  Dr Henry would like pt to be transferred to MAU for better evaluation.  AP will call [facility] to transport pt

## 2023-09-19 NOTE — Progress Notes (Addendum)
 Mary Ware called and asked to just hold fhr monitor in place for a couple of minutes.  They say pt is continuing to vomit and cannot sit still.  They also report a strong mariajuana smell.

## 2023-09-19 NOTE — Progress Notes (Signed)
 Dr Henry notified of lack of fhr tracing.  She says that pt may be removed from monitors at this time.  Pt will be evaluated in MAU.

## 2023-09-19 NOTE — Progress Notes (Signed)
 Mary Ware says that [facility] is in route

## 2023-09-26 ENCOUNTER — Other Ambulatory Visit: Payer: Self-pay | Admitting: Obstetrics and Gynecology

## 2023-09-26 DIAGNOSIS — O365920 Maternal care for other known or suspected poor fetal growth, second trimester, not applicable or unspecified: Secondary | ICD-10-CM

## 2023-10-02 ENCOUNTER — Encounter: Payer: Self-pay | Admitting: Skilled Nursing Facility1

## 2023-10-02 ENCOUNTER — Encounter: Attending: Obstetrics and Gynecology | Admitting: Skilled Nursing Facility1

## 2023-10-02 VITALS — Ht 60.0 in | Wt 120.2 lb

## 2023-10-02 DIAGNOSIS — O365910 Maternal care for other known or suspected poor fetal growth, first trimester, not applicable or unspecified: Secondary | ICD-10-CM | POA: Diagnosis present

## 2023-10-02 NOTE — Progress Notes (Signed)
 Medical Nutrition Therapy  Appointment Start time:  3:31  Appointment End time:  4:16  Primary concerns today: pt states she is not sure why her doctor referred her  Referral diagnosis: o36.59  NUTRITION ASSESSMENT    Clinical Medical Hx: GERD, heart murmur, kidney  Medications: see list Labs: A1C 4.8 Notable Signs/Symptoms: fatigue, possible hypoglycemia symptoms   Lifestyle & Dietary Hx  Pt states she is a picky eater and has been for many years. 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  Pt states she goes to sleep around 12am-1pm and gets up around 4-5am.  Foods Accepted: All vegetables Chicken Salmon Flounder [place] Mac n cheese Spaghetti All fruit  Estimated daily fluid intake:  oz Supplements:  Sleep:  Stress / self-care:  Current average weekly physical activity:   24-Hr Dietary Recall First Meal: fast food Snack:  Second Meal: skipped Snack:  Third Meal: bacon and eggs sandwich  Snack:  Beverages: water, juice, soda   NUTRITION INTERVENTION  Nutrition education (E-1) on the following topics:  1. Recommended Weight Gain During Pregnancy Explanation of healthy weight gain targets based on pre-pregnancy BMI (underweight = higher target of 28-40 pounds total). Importance of steady weight gain for fetal growth and pregnancy health. 2. Increased Caloric Needs Caloric requirements increase by approximately 350-450 calories/day in the second and third trimesters. Emphasize the need to gradually increase calorie intake to promote catch-up weight gain. 3. Protein Requirements Protein needs increase to about 75-100 grams per day to support fetal development and maternal tissue growth. Include good protein sources: lean meats, dairy, legumes, eggs, nuts, and seeds. 4. Nutrient-Dense Foods and Balanced Meals Focus on nutrient-rich foods that provide vitamins, minerals, and energy without excess volume. Emphasize whole grains, healthy fats (avocado, nuts, olive oil), fruits,  and vegetables. 5. Meal Frequency and Portion Size Encourage small, frequent meals and snacks every 2-3 hours to improve intake and minimize digestive discomfort. Sample meal and snack ideas to help meet nutritional goals. 6. Hydration Importance of staying hydrated during pregnancy; aim for 8-10 cups of fluids daily, primarily water. 7. Micronutrient Support Importance of continuing prenatal vitamins (especially folic acid , iron, vitamin D). Iron-rich foods and pairing with vitamin C for better absorption. 8. Managing Common Barriers poor appetite, fatigue, or other pregnancy symptoms that impact eating. When to Seek Medical Help Signs to report immediately: decreased fetal movement, inability to eat/drink, sudden weight loss, or symptoms of preterm labor.  Learning Style & Readiness for Change Teaching method utilized: Visual & Auditory  Demonstrated degree of understanding via: Teach Back  Barriers to learning/adherence to lifestyle change: lack of appetite   Goals Established by Pt Have [employer] most of the meals  Have breakfast with your son: yogurt and eggs or toast and eggs or yogurt parfait or oatmeal with nuts or peanut butter or scrambled eggs made with yogurt or cream cheese or sour cream or heavy cream Have a snack about 2-3 hours later: granola bar dipped in peanut butter or bag of nuts and fruit or cheese toast Lunch ideas: meat and cheese sandwich with chips or rotisserie chicken sandwich with cheese and carrots and peanut butter Dinner: potato and cheese and broccoli soup with bread or spagetti with cheese and bread and squash    MONITORING & EVALUATION Dietary intake, weekly physical activity  Next Steps  Patient is to call or email with any future questions, concerns, or weight loss/no weight gain within 1-2 weeks.

## 2023-10-08 ENCOUNTER — Encounter: Payer: Self-pay | Admitting: *Deleted

## 2023-10-08 DIAGNOSIS — O365990 Maternal care for other known or suspected poor fetal growth, unspecified trimester, not applicable or unspecified: Secondary | ICD-10-CM | POA: Insufficient documentation

## 2023-10-08 DIAGNOSIS — Z8759 Personal history of other complications of pregnancy, childbirth and the puerperium: Secondary | ICD-10-CM | POA: Insufficient documentation

## 2023-10-08 DIAGNOSIS — O4442 Low lying placenta NOS or without hemorrhage, second trimester: Secondary | ICD-10-CM | POA: Insufficient documentation

## 2023-10-09 ENCOUNTER — Other Ambulatory Visit: Payer: Self-pay | Admitting: *Deleted

## 2023-10-09 ENCOUNTER — Ambulatory Visit: Admitting: Obstetrics and Gynecology

## 2023-10-09 ENCOUNTER — Other Ambulatory Visit: Payer: Self-pay | Admitting: Obstetrics and Gynecology

## 2023-10-09 ENCOUNTER — Ambulatory Visit: Attending: Obstetrics and Gynecology

## 2023-10-09 VITALS — BP 112/66 | HR 64

## 2023-10-09 DIAGNOSIS — Z3A37 37 weeks gestation of pregnancy: Secondary | ICD-10-CM | POA: Diagnosis not present

## 2023-10-09 DIAGNOSIS — O365930 Maternal care for other known or suspected poor fetal growth, third trimester, not applicable or unspecified: Secondary | ICD-10-CM | POA: Diagnosis not present

## 2023-10-09 DIAGNOSIS — Z3A27 27 weeks gestation of pregnancy: Secondary | ICD-10-CM | POA: Insufficient documentation

## 2023-10-09 DIAGNOSIS — O365920 Maternal care for other known or suspected poor fetal growth, second trimester, not applicable or unspecified: Secondary | ICD-10-CM

## 2023-10-09 DIAGNOSIS — O09299 Supervision of pregnancy with other poor reproductive or obstetric history, unspecified trimester: Secondary | ICD-10-CM

## 2023-10-09 DIAGNOSIS — O4442 Low lying placenta NOS or without hemorrhage, second trimester: Secondary | ICD-10-CM | POA: Insufficient documentation

## 2023-10-09 DIAGNOSIS — O09292 Supervision of pregnancy with other poor reproductive or obstetric history, second trimester: Secondary | ICD-10-CM

## 2023-10-09 NOTE — Progress Notes (Signed)
 Name: Mary Ware  MRN: [id number]  GA: [id number] [redacted]w[redacted]d  Patient is here for a second opinion.  At your office ultrasound, fetal growth restriction (estimated fetal weight at the 3rd percentile) and low-lying placenta were seen.  Her EDD was established at 01/04/2024 by first trimester ultrasound.  Obstetrical history is significant for a term vacuum-assisted vaginal delivery in 2015 of a female infant weighing 5 pounds and 12 ounces at birth.  Her pregnancy was not complicated by fetal growth restriction.  Patient does not have any chronic medical conditions including diabetes or hypertension.  Blood pressure today at our office is 112/66 mmHg.  Ultrasound On today's ultrasound, the estimated fetal weight is at the 2nd percentile and the abdominal circumference measurement at the 7th percentile.  Head circumference measurement is at between -1 and mean (normal).  Fetal anatomical survey appears normal but limited by advanced gestational age.  Umbilical artery Doppler showed normal forward diastolic flow. Placenta is low-lying and the placental edge is 1.5 to 1.8 cm from the internal os.  Color Doppler showed no evidence of vasa previa.  Fetal growth restriction I explained the finding of fetal growth restriction that is difficult to differentiate from a constitutionally small for gestational age fetus. Most fetuses are small but not growth restricted.  Her previous baby weighed 5 pounds and 12 ounces at birth.  I discussed the possible causes of fetal growth restriction including placental insufficiency (most common cause), chromosomal anomalies and fetal infections.  Patient did not give history of fever or rashes.  I explained that only amniocentesis will give a definitive result on the fetal karyotype and some genetic conditions (Microarray).  I explained amniocentesis procedure and possible complication of preterm delivery (1 and 500 procedures). I discussed ultrasound protocol of  monitoring fetal growth restriction. Timing of delivery: Since small for gestational age fetuses have a higher chance of having perinatal mortality and morbidity, delivery at 62 to [redacted] weeks gestation is reasonable.  If, however, severe fetal growth restriction is seen with normal antenatal testing, we will recommend delivery at [redacted] weeks gestation. We will discuss timing at delivery with future fetal growth assessments.  Low-lying placenta I reassured the patient that most low-lying placenta is resolved with advancing gestation.  Patient does not give history of vaginal bleeding.  Vaginal delivery can be safely attempted if placental edge is 2 cm or more from the internal os.   Recommendations - UA Doppler and NST next week. - Continue weekly antenatal testing till delivery. - If low-lying placenta persists on transabdominal scan at her next fetal growth assessment (3 weeks) transvaginal ultrasound should be performed.  Consultation including face-to-face (more than 50%) counseling 30 minutes.

## 2023-10-18 ENCOUNTER — Inpatient Hospital Stay (HOSPITAL_COMMUNITY)
Admission: AD | Admit: 2023-10-18 | Discharge: 2023-10-18 | Disposition: A | Discharge status: Home / Self Care | Attending: Family Medicine | Admitting: Family Medicine

## 2023-10-18 ENCOUNTER — Inpatient Hospital Stay (HOSPITAL_COMMUNITY)

## 2023-10-18 ENCOUNTER — Encounter (HOSPITAL_COMMUNITY): Payer: Self-pay | Admitting: Family Medicine

## 2023-10-18 DIAGNOSIS — O4693 Antepartum hemorrhage, unspecified, third trimester: Secondary | ICD-10-CM | POA: Diagnosis present

## 2023-10-18 DIAGNOSIS — O4443 Low lying placenta NOS or without hemorrhage, third trimester: Secondary | ICD-10-CM

## 2023-10-18 DIAGNOSIS — N939 Abnormal uterine and vaginal bleeding, unspecified: Secondary | ICD-10-CM

## 2023-10-18 DIAGNOSIS — O365930 Maternal care for other known or suspected poor fetal growth, third trimester, not applicable or unspecified: Secondary | ICD-10-CM | POA: Insufficient documentation

## 2023-10-18 DIAGNOSIS — O368130 Decreased fetal movements, third trimester, not applicable or unspecified: Secondary | ICD-10-CM | POA: Diagnosis not present

## 2023-10-18 DIAGNOSIS — O4453 Low lying placenta with hemorrhage, third trimester: Secondary | ICD-10-CM | POA: Diagnosis not present

## 2023-10-18 DIAGNOSIS — Z363 Encounter for antenatal screening for malformations: Secondary | ICD-10-CM | POA: Insufficient documentation

## 2023-10-18 DIAGNOSIS — O479 False labor, unspecified: Secondary | ICD-10-CM

## 2023-10-18 DIAGNOSIS — O09293 Supervision of pregnancy with other poor reproductive or obstetric history, third trimester: Secondary | ICD-10-CM

## 2023-10-18 DIAGNOSIS — O358XX0 Maternal care for other (suspected) fetal abnormality and damage, not applicable or unspecified: Secondary | ICD-10-CM | POA: Diagnosis not present

## 2023-10-18 DIAGNOSIS — Z3A28 28 weeks gestation of pregnancy: Secondary | ICD-10-CM

## 2023-10-18 LAB — URINALYSIS, ROUTINE W REFLEX MICROSCOPIC
Bilirubin Urine: NEGATIVE
Glucose, UA: NEGATIVE mg/dL
Ketones, ur: NEGATIVE mg/dL
Leukocytes,Ua: NEGATIVE
Nitrite: NEGATIVE
Protein, ur: NEGATIVE mg/dL
Specific Gravity, Urine: 1.009 (ref 1.005–1.030)
pH: 8 (ref 5.0–8.0)

## 2023-10-18 LAB — CBC
HCT: 39.3 % (ref 36.0–46.0)
Hemoglobin: 13.6 g/dL (ref 12.0–15.0)
MCH: 32.9 pg (ref 26.0–34.0)
MCHC: 34.6 g/dL (ref 30.0–36.0)
MCV: 94.9 fL (ref 80.0–100.0)
Platelets: 199 K/uL (ref 150–400)
RBC: 4.14 MIL/uL (ref 3.87–5.11)
RDW: 12.3 % (ref 11.5–15.5)
WBC: 17.8 K/uL — ABNORMAL HIGH (ref 4.0–10.5)
nRBC: 0 % (ref 0.0–0.2)

## 2023-10-18 LAB — COMPREHENSIVE METABOLIC PANEL WITH GFR
ALT: 17 U/L (ref 0–44)
AST: 26 U/L (ref 15–41)
Albumin: 2.9 g/dL — ABNORMAL LOW (ref 3.5–5.0)
Alkaline Phosphatase: 108 U/L (ref 38–126)
Anion gap: 8 (ref 5–15)
BUN: <5 mg/dL — ABNORMAL LOW (ref 6–20)
CO2: 21 mmol/L — ABNORMAL LOW (ref 22–32)
Calcium: 8.5 mg/dL — ABNORMAL LOW (ref 8.9–10.3)
Chloride: 106 mmol/L (ref 98–111)
Creatinine, Ser: 0.5 mg/dL (ref 0.44–1.00)
GFR, Estimated: >60 mL/min (ref >60)
Glucose, Bld: 90 mg/dL (ref 70–99)
Potassium: 3.5 mmol/L (ref 3.5–5.1)
Sodium: 135 mmol/L (ref 135–145)
Total Bilirubin: 0.5 mg/dL (ref 0.0–1.2)
Total Protein: 5.7 g/dL — ABNORMAL LOW (ref 6.5–8.1)

## 2023-10-18 LAB — WET PREP, GENITAL
Clue Cells Wet Prep HPF POC: NONE SEEN
Sperm: NONE SEEN
Trich, Wet Prep: NONE SEEN
WBC, Wet Prep HPF POC: <10 (ref <10)
Yeast Wet Prep HPF POC: NONE SEEN

## 2023-10-18 LAB — FETAL FIBRONECTIN: Fetal Fibronectin: POSITIVE — AB

## 2023-10-18 MED ORDER — MAGNESIUM SULFATE 40 GM/1000ML IV SOLN
1.0000 g/h | INTRAVENOUS | Status: DC
  Administered 2023-10-18: 1 g/h via INTRAVENOUS
  Filled 2023-10-18: qty 1000

## 2023-10-18 MED ORDER — LACTATED RINGERS IV SOLN: INTRAVENOUS | Status: DC

## 2023-10-18 MED ORDER — MAGNESIUM SULFATE BOLUS VIA INFUSION
4.0000 g | Freq: Once | INTRAVENOUS | Status: AC
Start: 2023-10-18 — End: 2023-10-18
  Administered 2023-10-18: 4 g via INTRAVENOUS
  Filled 2023-10-18: qty 1000

## 2023-10-18 MED ORDER — BETAMETHASONE SOD PHOS & ACET 6 (3-3) MG/ML IJ SUSP
12.0000 mg | Freq: Once | INTRAMUSCULAR | Status: AC
  Administered 2023-10-18: 12 mg via INTRAMUSCULAR
  Filled 2023-10-18: qty 5

## 2023-10-18 NOTE — MAU Note (Signed)
 Verbal order to discontinue magnesium  infusion. RN to observe patient for 20-30 minutes and call prior to discharge for update on condition.

## 2023-10-18 NOTE — Progress Notes (Signed)
 Mary Ware is a 28 y.o. G2P1001 at [redacted]w[redacted]d who presented to the MAU with several hours of vaginal bleeding this morning.   She receives her OB care with [facility].   Pregnancy is complicated by:  -Poor fetal growth -Low lying placenta  Subjective: Patient states she saw a plum sized spot of pink discharge in her underwear this morning. It has since decreased to only when she wipes and is light pink. She denies abdominal pain, and having cramping that is 2-3/10. Endorses +FM, no LOF.   Per MAU provider initial contractions have subsided following magnesium  treatment.   Given BMZ x1 in MAU and IVF.   Denies fevers, chills, chest pain, visual changes, SOB, RUQ/epigastric pain, N/V, dysuria, hematuria, or sudden onset/worsening bilateral LE or facial edema.  Objective: BP (!) 94/54 (BP Location: Right Arm)   Pulse 68   Temp 98.3 F (36.8 C) (Oral)   Resp 14   LMP 03/29/2023   SpO2 99%  Gen:  NAD, pleasant and cooperative Cardio: regular rate noted, soft BPs appears to be her baseline.  Pulm: Normal WOB Abd:  Soft, gravid, non-distended, non-tender throughout, no rebound/guarding Ext:  No bilateral LE edema SVE: closed per MAU provider  Results for orders placed or performed during the hospital encounter of 10/18/23  Urinalysis, Routine w reflex microscopic -Urine, Clean Catch   Collection Time: 10/18/23 10:25 AM  Result Value Ref Range   Color, Urine YELLOW YELLOW   APPearance HAZY (A) CLEAR   Specific Gravity, Urine 1.009 1.005 - 1.030   pH 8.0 5.0 - 8.0   Glucose, UA NEGATIVE NEGATIVE mg/dL   Hgb urine dipstick MODERATE (A) NEGATIVE   Bilirubin Urine NEGATIVE NEGATIVE   Ketones, ur NEGATIVE NEGATIVE mg/dL   Protein, ur NEGATIVE NEGATIVE mg/dL   Nitrite NEGATIVE NEGATIVE   Leukocytes,Ua NEGATIVE NEGATIVE   RBC / HPF 0-5 0 - 5 RBC/hpf   WBC, UA 0-5 0 - 5 WBC/hpf   Bacteria, UA RARE (A) NONE SEEN   Squamous Epithelial / HPF 0-5 0 - 5 /HPF   Mucus PRESENT   Wet prep,  genital   Collection Time: 10/18/23 10:42 AM   Specimen: Vaginal  Result Value Ref Range   Yeast Wet Prep HPF POC NONE SEEN NONE SEEN   Trich, Wet Prep NONE SEEN NONE SEEN   Clue Cells Wet Prep HPF POC NONE SEEN NONE SEEN   WBC, Wet Prep HPF POC <10 <10   Sperm NONE SEEN   Fetal fibronectin   Collection Time: 10/18/23 10:43 AM  Result Value Ref Range   Fetal Fibronectin POSITIVE (A) NEGATIVE  CBC   Collection Time: 10/18/23 10:54 AM  Result Value Ref Range   WBC 17.8 (H) 4.0 - 10.5 K/uL   RBC 4.14 3.87 - 5.11 MIL/uL   Hemoglobin 13.6 12.0 - 15.0 g/dL   HCT 60.6 63.9 - 53.9 %   MCV 94.9 80.0 - 100.0 fL   MCH 32.9 26.0 - 34.0 pg   MCHC 34.6 30.0 - 36.0 g/dL   RDW 87.6 88.4 - 84.4 %   Platelets 199 150 - 400 K/uL   nRBC 0.0 0.0 - 0.2 %  Comprehensive metabolic panel   Collection Time: 10/18/23 10:54 AM  Result Value Ref Range   Sodium 135 135 - 145 mmol/L   Potassium 3.5 3.5 - 5.1 mmol/L   Chloride 106 98 - 111 mmol/L   CO2 21 (L) 22 - 32 mmol/L   Glucose, Bld 90 70 -  99 mg/dL   BUN <5 (L) 6 - 20 mg/dL   Creatinine, Ser 9.49 0.44 - 1.00 mg/dL   Calcium 8.5 (L) 8.9 - 10.3 mg/dL   Total Protein 5.7 (L) 6.5 - 8.1 g/dL   Albumin 2.9 (L) 3.5 - 5.0 g/dL   AST 26 15 - 41 U/L   ALT 17 0 - 44 U/L   Alkaline Phosphatase 108 38 - 126 U/L   Total Bilirubin 0.5 0.0 - 1.2 mg/dL   GFR, Estimated >39 >39 mL/min   Anion gap 8 5 - 15    ----------------------------------------------------------------------  OBSTETRICS REPORT                       (Signed Final 10/18/2023 12:20 pm) ---------------------------------------------------------------------- Patient Info    ID #:       [id number]                          D.O.B.:  1995-05-02 (28 yrs)(F)  Name:       Mary Ware              Visit Date: 10/18/2023 10:56 am ---------------------------------------------------------------------- Performed By    Attending:        Fredia Fresh MD        Referred By:       [facility] MAU/Triage   Performed By:     Metta Ar          [facility] ---------------------------------------------------------------------- Orders    #  Description                           Code        Ordered By  1  US  MFM OB LIMITED                     23184.98    COLTER CASHION ----------------------------------------------------------------------    #  Order #                     Accession #                Episode #  1  [id number]                   [id number]                 [id number] ---------------------------------------------------------------------- Indications    Vaginal bleeding in pregnancy, third trimester  O46.93  [redacted] weeks gestation of pregnancy                 Z3A.28  Maternal care for known or suspected poor       O36.5930  fetal growth, third trimester, single or  unspecified fetus IUGR  Low lying placenta, antepartum                  O44.40  Fetal or maternal indication (From [facility]-        O35.8XX0  IUGR & LL PL)  Poor  obstetric history: Previous fetal growth   O09.299  restriction (FGR)  Encounter for antenatal screening for           Z36.3  malformations  LR female ---------------------------------------------------------------------- Fetal Evaluation    Num Of Fetuses:          1  Fetal Heart Rate(bpm):   136  Cardiac Activity:        Observed  Presentation:            Cephalic  Placenta:                Posterior, low-lying, 1.7cm from int os  P. Cord Insertion:       Previously seen    Amniotic Fluid  AFI FV:      Within normal limits    AFI Sum(cm)     %Tile       Largest Pocket(cm)  10              12          3.9    RUQ(cm)       RLQ(cm)       LUQ(cm)        LLQ(cm)  3.9           2.3           0.7            3.1    Comment:    No placental abruption or previa identified. ---------------------------------------------------------------------- Biometry  LV:         3.2  mm ---------------------------------------------------------------------- OB History    Gravidity:    2         Term:   1  Living:       1 ---------------------------------------------------------------------- Gestational Age    LMP:           29w 1d        Date:  03/28/23                  EDD:   01/02/24  Best:          caswell 6d     Det. By:  Previous Ultrasound      EDD:   01/04/24 ---------------------------------------------------------------------- Anatomy    Cranium:               Appears normal         Kidneys:                Appear normal  Diaphragm:             Appears normal         Bladder:                Appears normal  Stomach:               Appears normal, left                         sided ---------------------------------------------------------------------- Cervix Uterus Adnexa    Cervix  Length:            4.1  cm.  Normal appearance by transabdominal scan ---------------------------------------------------------------------- Impression    Patient presented to the MAU with vaginal bleeding.  At our office ultrasound last week, fetal growth restriction and  low-lying placenta were seen.  On today's ultrasound, amniotic fluid is normal and good fetal  activity is seen. On transabdominal scan, low-lying placenta  is seen (1.7 cm  from the internal os). No evidence of  placental abruption. Ultrasound has limitations in detecting  placental abruption.  Discussed with Dr. Letha. Bleeding seems more-likely  from low-lying placenta.  Patient may be discharged if no heavy bleeding is seen. She  has an appointment at our office on Monday.   ----------------------------------------------------------------------                 Fredia Fresh, MD Electronically Signed Final Report   10/18/2023 12:20 pm ----------------------------------------------------------------------    A/P: Mary Ware is a 28 y.o. G2P1001 @ [redacted]w[redacted]d presented for vaginal bleeding.  She is in stable condition. No evidence of abruption on US . Cat I fetal status. I discussed the case with MFM Dr. Fresh who feels she is appropriate for outpatient follow up given no evidence of abruption and improving in clinical course. Discontinue magnesium  and observe for additional 30 mins. Give strict return precautions. Encourage her to follow up with MFM 9/15 and keep routine OB visit. [facility] midwife made aware and coordinate close follow up for next week.   Rojean Letha, DO [facility] 10/18/2023, 2:16 PM

## 2023-10-18 NOTE — MAU Provider Note (Signed)
 History     [id number]  Arrival date and time: 10/18/23 1013    Chief Complaint  Patient presents with   Vaginal Bleeding   Abdominal Pain   Decreased Fetal Movement     HPI Mary Ware is a 28 y.o. at [redacted]w[redacted]d, who presents for vaginal bleeding. It started this morning, she is unsure what time. She usually gets up in the middle of the night to urinate and did so this morning around 0300-0400. She didn't notice anything abnormal at that time but did not flush the toilet. When she woke up this morning around 0800, she noticed small plum size clots in her underwear and noticed blood in the toilet from when she went to the bathroom previously. She did have some vaginal bleeding in first trimester but none since that time. She also endorses contractions that are occurring every 3-5 minutes. They vary in intensity. Denies leakage of fluid. Denies recent sexual intercourse or anything in the vagina in the last week. Denies recent sickness, fever chills, intense abdominal pain.      Past Medical History:  Diagnosis Date   GERD (gastroesophageal reflux disease)    Heart murmur    h/o r/t pulmonary valve stenosis   Intractable vomiting    Kidney stones    Pulmonary valve stenosis    AS A CHILD-PT HAS OUTGROWN AND REVERSED ITSELF PER PT AND DOES NOT SEE A CARDIOLOGIST-ASYMPTOMATIC    Past Surgical History:  Procedure Laterality Date   CHOLECYSTECTOMY N/A 04/27/2015   Procedure: LAPAROSCOPIC CHOLECYSTECTOMY WITH INTRAOPERATIVE CHOLANGIOGRAM;  Surgeon: Larinda Unknown Sharps, MD;  Location: [facility] ORS;  Service: General;  Laterality: N/A;   WISDOM TOOTH EXTRACTION      Family History  Problem Relation Age of Onset   Cancer Mother        lung/mets to brain   COPD Mother    Hypertension Mother    Diabetes Mother    Emphysema Mother    Other Father        unknonwn, doesn't go    Social History   Socioeconomic History   Marital status: Married    Spouse name: Not on file    Number of children: Not on file   Years of education: Not on file   Highest education level: Not on file  Occupational History   Not on file  Tobacco Use   Smoking status: Former    Current packs/day: 0.50    Types: Cigars, Cigarettes   Smokeless tobacco: Never   Tobacco comments:    Quit Feb 2025  Vaping Use   Vaping status: Never Used  Substance and Sexual Activity   Alcohol use: No   Drug use: Not Currently    Types: Benzodiazepines, Marijuana    Comment: + UDS in 2016 for THC (tetrahydrocannabinol);nothing since +preg test 2025   Sexual activity: Not Currently    Birth control/protection: Implant  Other Topics Concern   Not on file  Social History Narrative   Not on file   Social Drivers of Health   Financial Resource Strain: Not on file  Food Insecurity: Not on file  Transportation Needs: Not on file  Physical Activity: Not on file  Stress: Not on file  Social Connections: Not on file  Intimate Partner Violence: Not on file    Allergies  Allergen Reactions   Coconut Fatty Acid Anaphylaxis    No current facility-administered medications on file prior to encounter.   Current Outpatient Medications on File Prior to Encounter  Medication Sig Dispense Refill   Prenatal Vit-Fe Fumarate-FA (PRENATAL VITAMINS PO) Take 1 tablet by mouth daily.     VITAMIN D PO Take by mouth.      Pertinent positives and negative per HPI, all others reviewed and negative  Physical Exam   BP (!) 110/56 (BP Location: Right Arm)   Pulse (!) 56   Temp 98.3 F (36.8 C) (Oral)   Resp 15   LMP 03/29/2023   SpO2 100%   Patient Vitals for the past 24 hrs:  BP Temp Temp src Pulse Resp SpO2  10/18/23 1230 (!) 110/56 98.3 F (36.8 C) Oral (!) 56 15 100 %  10/18/23 1215 103/61 -- -- (!) 59 -- 100 %  10/18/23 1200 92/78 -- -- 97 -- 100 %  10/18/23 1135 115/60 -- -- 69 -- 99 %  10/18/23 1131 97/60 -- -- 68 -- --  10/18/23 1130 -- -- -- -- -- 99 %  10/18/23 1126 115/61 -- -- 74 -- 99  %  10/18/23 1121 109/65 -- -- 75 18 99 %  10/18/23 1117 121/75 -- -- 88 -- 100 %  10/18/23 1111 111/60 -- -- 68 -- 100 %  10/18/23 1110 (!) 112/57 98.2 F (36.8 C) Oral 63 16 99 %  10/18/23 1055 117/70 -- -- 75 -- --  10/18/23 1042 111/65 98.4 F (36.9 C) Oral 71 14 100 %    Physical Exam Vitals and nursing note reviewed.  Constitutional:      Appearance: She is well-developed.  HENT:     Head: Normocephalic and atraumatic.     Mouth/Throat:     Mouth: Mucous membranes are moist.  Eyes:     Extraocular Movements: Extraocular movements intact.  Cardiovascular:     Rate and Rhythm: Normal rate and regular rhythm.  Pulmonary:     Effort: Pulmonary effort is normal.  Abdominal:     Palpations: Abdomen is soft.     Tenderness: There is no abdominal tenderness.  Skin:    Capillary Refill: Capillary refill takes less than 2 seconds.  Neurological:     General: No focal deficit present.     Mental Status: She is alert.      Cervical Exam Dilation: Closed (externally FT) Presentation: Vertex Exam by:: Dr. Magali EHRICH Baseline: 135 bpm Variability: Good {> 6 bpm) Accelerations: Reactive Decelerations: Absent Uterine activity: Frequency: Every 2-10 minutes, irregular   Labs Results for orders placed or performed during the hospital encounter of 10/18/23 (from the past 24 hours)  Urinalysis, Routine w reflex microscopic -Urine, Clean Catch     Status: Abnormal   Collection Time: 10/18/23 10:25 AM  Result Value Ref Range   Color, Urine YELLOW YELLOW   APPearance HAZY (A) CLEAR   Specific Gravity, Urine 1.009 1.005 - 1.030   pH 8.0 5.0 - 8.0   Glucose, UA NEGATIVE NEGATIVE mg/dL   Hgb urine dipstick MODERATE (A) NEGATIVE   Bilirubin Urine NEGATIVE NEGATIVE   Ketones, ur NEGATIVE NEGATIVE mg/dL   Protein, ur NEGATIVE NEGATIVE mg/dL   Nitrite NEGATIVE NEGATIVE   Leukocytes,Ua NEGATIVE NEGATIVE   RBC / HPF 0-5 0 - 5 RBC/hpf   WBC, UA 0-5 0 - 5 WBC/hpf   Bacteria,  UA RARE (A) NONE SEEN   Squamous Epithelial / HPF 0-5 0 - 5 /HPF   Mucus PRESENT   Wet prep, genital     Status: None   Collection Time: 10/18/23 10:42 AM   Specimen: Vaginal  Result Value Ref Range   Yeast Wet Prep HPF POC NONE SEEN NONE SEEN   Trich, Wet Prep NONE SEEN NONE SEEN   Clue Cells Wet Prep HPF POC NONE SEEN NONE SEEN   WBC, Wet Prep HPF POC <10 <10   Sperm NONE SEEN   Fetal fibronectin     Status: Abnormal   Collection Time: 10/18/23 10:43 AM  Result Value Ref Range   Fetal Fibronectin POSITIVE (A) NEGATIVE  CBC     Status: Abnormal   Collection Time: 10/18/23 10:54 AM  Result Value Ref Range   WBC 17.8 (H) 4.0 - 10.5 K/uL   RBC 4.14 3.87 - 5.11 MIL/uL   Hemoglobin 13.6 12.0 - 15.0 g/dL   HCT 60.6 63.9 - 53.9 %   MCV 94.9 80.0 - 100.0 fL   MCH 32.9 26.0 - 34.0 pg   MCHC 34.6 30.0 - 36.0 g/dL   RDW 87.6 88.4 - 84.4 %   Platelets 199 150 - 400 K/uL   nRBC 0.0 0.0 - 0.2 %  Comprehensive metabolic panel     Status: Abnormal   Collection Time: 10/18/23 10:54 AM  Result Value Ref Range   Sodium 135 135 - 145 mmol/L   Potassium 3.5 3.5 - 5.1 mmol/L   Chloride 106 98 - 111 mmol/L   CO2 21 (L) 22 - 32 mmol/L   Glucose, Bld 90 70 - 99 mg/dL   BUN <5 (L) 6 - 20 mg/dL   Creatinine, Ser 9.49 0.44 - 1.00 mg/dL   Calcium 8.5 (L) 8.9 - 10.3 mg/dL   Total Protein 5.7 (L) 6.5 - 8.1 g/dL   Albumin 2.9 (L) 3.5 - 5.0 g/dL   AST 26 15 - 41 U/L   ALT 17 0 - 44 U/L   Alkaline Phosphatase 108 38 - 126 U/L   Total Bilirubin 0.5 0.0 - 1.2 mg/dL   GFR, Estimated >39 >39 mL/min   Anion gap 8 5 - 15    Imaging US  MFM OB LIMITED Result Date: 10/18/2023 ----------------------------------------------------------------------  OBSTETRICS REPORT                       (Signed Final 10/18/2023 12:20 pm) ---------------------------------------------------------------------- Patient Info  ID #:       [id number]                          D.O.B.:  06-22-1995 (28 yrs)(F)  Name:       Mary Ware              Visit Date: 10/18/2023 10:56 am ---------------------------------------------------------------------- Performed By  Attending:        Fredia Fresh MD        Referred By:       [facility] MAU/Triage  Performed By:     Metta Ar          [facility] ---------------------------------------------------------------------- Orders  #  Description  Code        Ordered By  1  US  MFM OB LIMITED                     [id number]    Sem Mccaughey ----------------------------------------------------------------------  #  Order #                     Accession #                Episode #  1  [id number]                   [id number]                 [id number] ---------------------------------------------------------------------- Indications  Vaginal bleeding in pregnancy, third trimester  O46.93  [redacted] weeks gestation of pregnancy                 Z3A.28  Maternal care for known or suspected poor       O36.5930  fetal growth, third trimester, single or  unspecified fetus IUGR  Low lying placenta, antepartum                  O44.40  Fetal or maternal indication (From [facility]-        O35.8XX0  IUGR & LL PL)  Poor obstetric history: Previous fetal growth   O09.299  restriction (FGR)  Encounter for antenatal screening for           Z36.3  malformations  LR female ---------------------------------------------------------------------- Fetal Evaluation  Num Of Fetuses:          1  Fetal Heart Rate(bpm):   136  Cardiac Activity:        Observed  Presentation:            Cephalic  Placenta:                Posterior, low-lying, 1.7cm from int os  P. Cord Insertion:       Previously seen  Amniotic Fluid  AFI FV:      Within normal limits  AFI Sum(cm)     %Tile       Largest Pocket(cm)  10              12          3.9  RUQ(cm)       RLQ(cm)       LUQ(cm)        LLQ(cm)  3.9           2.3           0.7            3.1   Comment:    No placental abruption or previa identified. ---------------------------------------------------------------------- Biometry  LV:        3.2  mm ---------------------------------------------------------------------- OB History  Gravidity:    2         Term:   1  Living:       1 ---------------------------------------------------------------------- Gestational Age  LMP:           29w 1d        Date:  03/28/23                  EDD:   01/02/24  Best:          caswell 6d     Det. By:  Previous Ultrasound      EDD:  01/04/24 ---------------------------------------------------------------------- Anatomy  Cranium:               Appears normal         Kidneys:                Appear normal  Diaphragm:             Appears normal         Bladder:                Appears normal  Stomach:               Appears normal, left                         sided ---------------------------------------------------------------------- Cervix Uterus Adnexa  Cervix  Length:            4.1  cm.  Normal appearance by transabdominal scan ---------------------------------------------------------------------- Impression  Patient presented to the MAU with vaginal bleeding.  At our office ultrasound last week, fetal growth restriction and  low-lying placenta were seen.  On today's ultrasound, amniotic fluid is normal and good fetal  activity is seen. On transabdominal scan, low-lying placenta  is seen (1.7 cm from the internal os). No evidence of  placental abruption. Ultrasound has limitations in detecting  placental abruption.  Discussed with Dr. Letha. Bleeding seems more-likely  from low-lying placenta.  Patient may be discharged if no heavy bleeding is seen. She  has an appointment at our office on Monday. ----------------------------------------------------------------------                 Fredia Fresh, MD Electronically Signed Final Report   10/18/2023 12:20 pm ----------------------------------------------------------------------     MAU Course  Procedures Lab Orders         Wet prep, genital         Urinalysis, Routine w reflex microscopic -Urine, Clean Catch         CBC         Comprehensive metabolic panel         Fetal fibronectin    Meds ordered this encounter  Medications   betamethasone  acetate-betamethasone  sodium phosphate  (CELESTONE ) injection 12 mg   magnesium  bolus via infusion 4 g   DISCONTD: magnesium  sulfate 40 grams in SWI 1000 mL OB infusion   lactated ringers  infusion   Imaging Orders         US  MFM OB LIMITED     MDM Moderate (Level 3-4)  Assessment and Plan  #Decreased fetal movement, Vaginal bleeding in pregnancy, third trimester #[redacted] weeks gestation of pregnancy #preterm contractions  #FWB NST: Reactive  Mary Ware is a 28 y.o. at [redacted]w[redacted]d, who presents for vaginal bleeding. Also endorses DFM and contractions  -First pregnancy was term delivery at [redacted]w[redacted]d -Patient on arrival was uncomfortable with contractions every 1-3 minutes irregularly. -Recent U/S with MFM showed IUGR 2% with low lying placenta 1.5 to 1.8cm from internal os. Repeat U/S in the MAU is very similar. No evidence of abruption. MFM believes that bleeding likely from low-lying placenta.  -Initial speculum exam revealed moderate amount of blood clots in vaginal vault and cervix that appeared to be open somewhat. Cervical exam was deferred until ultrasound complete due to low-lying placenta. After U/S, cervix was checked and found to be closed. -FFN positive -U/A and wet prep do not indicate infectious process -Hgb stable -Concern for preterm labor.    -Magnesium  bolus and infusion started for neuro-protection -  Betamethasone  given for fetal lung maturity.  -Plan formulated and discussed with Dr. Eveline and Dr. Letha.  -Dr. Letha discussed case with MFM who recommended discharge if no heavy bleeding present. Of note, patient does have follow up appointment with MFM on 10/20/23. Magnesium  discontinued  initially and contractions started to pick back up. They spaced out and became irregular after a fluid bolus. -Patient has not noticed any more vaginal bleeding since arriving at MAU.  -Detailed preterm labor precautions provided.   -Stable to discharge home. -Patient will need to return tomorrow for second betamethasone  shot.  -Follow up for ROB and MFM as scheduled.    Keiland Pickering L Cyerra Yim, MD/MHA 10/18/23 1:36 PM  Allergies as of 10/18/2023       Reactions   Coconut Fatty Acid Anaphylaxis        Medication List     TAKE these medications    PRENATAL VITAMINS PO Take 1 tablet by mouth daily.   VITAMIN D PO Take by mouth.

## 2023-10-18 NOTE — MAU Note (Signed)
..  Mary Ware is a 28 y.o. at [redacted]w[redacted]d here in MAU reporting: This morning she woke up with a small amount of bright red vaginal bleeding in her underwear. It has since turned to spotting. She also was having some lower abdominal cramping. She reports that her pain is mild (2-3/10.) She also reports DFM this morning. She complains of urinary frequency as well.   Pain score: 3 Vitals:   10/18/23 1042  BP: 111/65  Pulse: 71  Resp: 14  Temp: 98.4 F (36.9 C)  SpO2: 100%     FHT:130 Lab orders placed from triage:   UA

## 2023-10-19 ENCOUNTER — Other Ambulatory Visit: Payer: Self-pay

## 2023-10-19 ENCOUNTER — Inpatient Hospital Stay (HOSPITAL_COMMUNITY)
Admission: AD | Admit: 2023-10-19 | Discharge: 2023-10-19 | Disposition: A | Discharge status: Home / Self Care | Attending: Family Medicine | Admitting: Family Medicine

## 2023-10-19 ENCOUNTER — Encounter (HOSPITAL_COMMUNITY): Payer: Self-pay | Admitting: Family Medicine

## 2023-10-19 ENCOUNTER — Inpatient Hospital Stay (HOSPITAL_COMMUNITY)
Admit: 2023-10-19 | Discharge: 2023-10-19 | Disposition: A | Discharge status: Home / Self Care | Attending: Family Medicine | Admitting: Family Medicine

## 2023-10-19 DIAGNOSIS — Z3689 Encounter for other specified antenatal screening: Secondary | ICD-10-CM | POA: Diagnosis not present

## 2023-10-19 DIAGNOSIS — Z6790 Unspecified blood type, Rh positive: Secondary | ICD-10-CM | POA: Diagnosis not present

## 2023-10-19 DIAGNOSIS — O26853 Spotting complicating pregnancy, third trimester: Secondary | ICD-10-CM | POA: Insufficient documentation

## 2023-10-19 DIAGNOSIS — O4443 Low lying placenta NOS or without hemorrhage, third trimester: Secondary | ICD-10-CM

## 2023-10-19 DIAGNOSIS — O4453 Low lying placenta with hemorrhage, third trimester: Secondary | ICD-10-CM | POA: Insufficient documentation

## 2023-10-19 DIAGNOSIS — O26893 Other specified pregnancy related conditions, third trimester: Secondary | ICD-10-CM | POA: Insufficient documentation

## 2023-10-19 DIAGNOSIS — O4693 Antepartum hemorrhage, unspecified, third trimester: Secondary | ICD-10-CM

## 2023-10-19 DIAGNOSIS — Z8659 Personal history of other mental and behavioral disorders: Secondary | ICD-10-CM

## 2023-10-19 DIAGNOSIS — Z3A29 29 weeks gestation of pregnancy: Secondary | ICD-10-CM | POA: Insufficient documentation

## 2023-10-19 DIAGNOSIS — O365930 Maternal care for other known or suspected poor fetal growth, third trimester, not applicable or unspecified: Secondary | ICD-10-CM | POA: Diagnosis not present

## 2023-10-19 MED ORDER — BETAMETHASONE SOD PHOS & ACET 6 (3-3) MG/ML IJ SUSP
12.0000 mg | Freq: Once | INTRAMUSCULAR | Status: AC
  Administered 2023-10-19: 12 mg via INTRAMUSCULAR
  Filled 2023-10-19: qty 5

## 2023-10-19 NOTE — Discharge Instructions (Signed)
 [facility] for [facility] [phone] [address] Floor [place], [place] [address] [email or web address]  [facility]  (open 24/7) [address] [place], [place] [address] [phone]  [facility] 24 Hour Crisis Line (local) [phone]  CALL or TEXT [phone]

## 2023-10-19 NOTE — MAU Provider Note (Addendum)
 Chief Complaint:  Vaginal Bleeding and Abdominal Pain  HPI   Event Date/Time   First Provider Initiated Contact with Patient 10/19/23 0914     [name] is a 28 y.o. G2P1001 at [redacted]w[redacted]d who presents to maternity admissions for second betamethasone  injection. First injection received yesterday in MAU when she was evaluated for contractions and vaginal bleeding. No longer having contractions. Reports passing single, quarter-sized, dark red clot this morning. When she undressed in MAU noticed spotting only. Reports vigorous fetal movement. Denies any leaking fluid or abnormal vaginal discharge.  Pregnancy Course: prenatal care received at [facility], complicated by IUGR and low lying placenta (1.7cm from os 10/18/23)  Past Medical History:  Diagnosis Date   Cholecystitis 04/27/2015   GERD (gastroesophageal reflux disease)    Heart murmur    h/o r/t pulmonary valve stenosis   Intractable vomiting    Kidney stones    Pancreatitis, acute 06/02/2015   Pulmonary valve stenosis    AS A CHILD-PT HAS OUTGROWN AND REVERSED ITSELF PER PT AND DOES NOT SEE A CARDIOLOGIST-ASYMPTOMATIC   OB History  Gravida Para Term Preterm AB Living  2 1 1   1   SAB IAB Ectopic Multiple Live Births      1    # Outcome Date GA Lbr Len/2nd Weight Sex Type Anes PTL Lv  2 Current           1 Term 07/26/13 [redacted]w[redacted]d 11:44 / 03:32 2608 g M Vag-Vacuum EPI  LIV   Past Surgical History:  Procedure Laterality Date   CHOLECYSTECTOMY N/A 04/27/2015   Procedure: LAPAROSCOPIC CHOLECYSTECTOMY WITH INTRAOPERATIVE CHOLANGIOGRAM;  Surgeon: Larinda Unknown Sharps, MD;  Location: [facility] ORS;  Service: General;  Laterality: N/A;   WISDOM TOOTH EXTRACTION     Family History  Problem Relation Age of Onset   Cancer Mother        lung/mets to brain   COPD Mother    Hypertension Mother    Diabetes Mother    Emphysema Mother    Other Father        unknonwn, doesn't go   Social History   Tobacco Use   Smoking status: Former     Current packs/day: 0.50    Types: Cigars, Cigarettes   Smokeless tobacco: Never   Tobacco comments:    Quit Feb 2025  Vaping Use   Vaping status: Never Used  Substance Use Topics   Alcohol use: No   Drug use: Not Currently    Types: Benzodiazepines, Marijuana    Comment: + UDS in 2016 for THC (tetrahydrocannabinol);nothing since +preg test 2025   Allergies  Allergen Reactions   Coconut Fatty Acid Anaphylaxis   Medications Prior to Admission  Medication Sig Dispense Refill Last Dose/Taking   Prenatal Vit-Fe Fumarate-FA (PRENATAL VITAMINS PO) Take 1 tablet by mouth daily.   10/18/2023 Evening   VITAMIN D PO Take by mouth.   10/18/2023 Evening   I have reviewed patient's Past Medical Hx, Surgical Hx, Family Hx, Social Hx, medications and allergies.   ROS  Pertinent items noted in HPI and remainder of comprehensive ROS otherwise negative.   PHYSICAL EXAM  Patient Vitals for the past 24 hrs:  BP Temp Pulse Resp SpO2  10/19/23 0919 (!) 106/54 98.6 F (37 C) 87 19 98 %  10/19/23 0850 108/73 98.3 F (36.8 C) 100 15 97 %  10/19/23 0836 122/61 98.9 F (37.2 C) 89 12 100 %   Constitutional: well-developed, well-nourished female in no acute distress.  Cardiovascular: normal rate, warm and well-perfused Respiratory: normal effort, no problems with respiration noted GI: abd soft, non-tender, gravid Extremities: nontender, no edema, normal ROM Neurologic: alert and oriented x 4 Pelvic: normal external genitalia, vaginal walls pink, cervix appears pink and closed, small amount of dark red blood present in vagina, no blood at cervical os  Manual cervical exam not indicated   Fetal Tracing: Reactive/Cat 1 Baseline: 130 Variability: moderate Accelerations: 15x15 Decelerations: none Toco: no UC seen on toco or reported by patient   Labs: No results found for this or any previous visit (from the past 24 hours).  Imaging:  US  MFM OB LIMITED Result Date:  10/18/2023 ----------------------------------------------------------------------  OBSTETRICS REPORT                       (Signed Final 10/18/2023 12:20 pm) ---------------------------------------------------------------------- Patient Info  ID #:       [id number]                          D.O.B.:  January 30, 1996 (28 yrs)(F)  Name:       Mary Ware              Visit Date: 10/18/2023 10:56 am ---------------------------------------------------------------------- Performed By  Attending:        Fredia Fresh MD        Referred By:       [facility] MAU/Triage  Performed By:     Metta Ar          [facility] ---------------------------------------------------------------------- Orders  #  Description                           Code        Ordered By  1  US  MFM OB LIMITED                     23184.98    COLTER CASHION ----------------------------------------------------------------------  #  Order #                     Accession #                Episode #  1  [id number]                   [id number]                 [id number] ---------------------------------------------------------------------- Indications  Vaginal bleeding in pregnancy, third trimester  O46.93  [redacted] weeks gestation of pregnancy                 Z3A.28  Maternal care for known or suspected poor       O36.5930  fetal growth, third trimester, single or  unspecified fetus IUGR  Low lying placenta, antepartum                  O44.40  Fetal or maternal indication (From [facility]-        O35.8XX0  IUGR &  LL PL)  Poor obstetric history: Previous fetal growth   O09.299  restriction (FGR)  Encounter for antenatal screening for           Z36.3  malformations  LR female ---------------------------------------------------------------------- Fetal Evaluation  Num Of Fetuses:          1  Fetal Heart Rate(bpm):   136  Cardiac Activity:        Observed  Presentation:            Cephalic   Placenta:                Posterior, low-lying, 1.7cm from int os  P. Cord Insertion:       Previously seen  Amniotic Fluid  AFI FV:      Within normal limits  AFI Sum(cm)     %Tile       Largest Pocket(cm)  10              12          3.9  RUQ(cm)       RLQ(cm)       LUQ(cm)        LLQ(cm)  3.9           2.3           0.7            3.1  Comment:    No placental abruption or previa identified. ---------------------------------------------------------------------- Biometry  LV:        3.2  mm ---------------------------------------------------------------------- OB History  Gravidity:    2         Term:   1  Living:       1 ---------------------------------------------------------------------- Gestational Age  LMP:           29w 1d        Date:  03/28/23                  EDD:   01/02/24  Best:          caswell 6d     Det. By:  Previous Ultrasound      EDD:   01/04/24 ---------------------------------------------------------------------- Anatomy  Cranium:               Appears normal         Kidneys:                Appear normal  Diaphragm:             Appears normal         Bladder:                Appears normal  Stomach:               Appears normal, left                         sided ---------------------------------------------------------------------- Cervix Uterus Adnexa  Cervix  Length:            4.1  cm.  Normal appearance by transabdominal scan ---------------------------------------------------------------------- Impression  Patient presented to the MAU with vaginal bleeding.  At our office ultrasound last week, fetal growth restriction and  low-lying placenta were seen.  On today's ultrasound, amniotic fluid is normal and good fetal  activity is seen. On transabdominal scan, low-lying placenta  is seen (1.7 cm from the internal os). No evidence of  placental abruption. Ultrasound has limitations in detecting  placental abruption.  Discussed with Dr. Letha. Bleeding seems more-likely  from low-lying  placenta.  Patient may be discharged if no heavy bleeding is seen. She  has an appointment at our office on Monday. ----------------------------------------------------------------------                 Fredia Fresh, MD Electronically Signed Final Report   10/18/2023 12:20 pm ----------------------------------------------------------------------   MDM & MAU COURSE  MDM: Moderate  MAU Course: Orders Placed This Encounter  Procedures   Discharge patient Discharge disposition: 01-Home or Self Care; Discharge patient date: 10/19/2023   Meds ordered this encounter  Medications   betamethasone  acetate-betamethasone  sodium phosphate  (CELESTONE ) injection 12 mg   - Speculum exam performed due to small amount of bleeding since yesterday, small amount of dark brown blood in vagina, not concerning for ongoing bleeding - No current symptoms of preterm labor and cervix closed on speculum exam - 2nd dose of betamethasone  ordered and given - Chart review shows history of PTSD, psychosis, and visual hallucinations in 2018. Patient reports this was related to her husband's mental health which is now stable. She does not report any current mental health concerns. Given  [facility] and emergency resources if needed. - Reviewed return precautions including regular contractions and vaginal bleeding - Follow up is scheduled with MFM and [facility] this week  ASSESSMENT   1. [redacted] weeks gestation of pregnancy   2. NST (non-stress test) reactive   3. Vaginal bleeding in pregnancy, third trimester   4. Low-lying placenta in third trimester   5. History of psychosis   6. Blood type, Rh positive     PLAN  Discharge home in stable condition with return precautions.     Follow-up Information     [facility]. Go to.   Specialty: Obstetrics and Gynecology Why: As needed Contact information: [address] [place]  [phone]         Ob/Gyn, [place] Follow up.   Specialty: Obstetrics and Gynecology Why: As scheduled Contact information: [address] [place] [address] [phone]                 Allergies as of 10/19/2023       Reactions   Coconut Fatty Acid Anaphylaxis        Medication List     TAKE these medications    PRENATAL VITAMINS PO Take 1 tablet by mouth daily.   VITAMIN D PO Take by mouth.      Vernell Ruddle, [facility] 10/19/2023 11:04 AM   Midwife Attestation:  I personally saw and evaluated the patient, performing the key elements of the service. I developed and verified the management plan that is described in the resident's/student's note, and I agree with the content with my edits above. VSS, HRR&R, Resp unlabored, Legs neg.    Olam Boards, CNM 6:01 PM

## 2023-10-19 NOTE — MAU Note (Signed)
.  Mary Ware is a 28 y.o. at [redacted]w[redacted]d here in MAU reporting: Patient reports she is here for second steroid shot, but woke up at 0700 and was bleeding including a quarter size blood clot. Patient reports she is still bleeding now, but it is a small amount. Patient reports she is having 1/10 pain its not like yesterday, but she can tell something is going on    Pain score: 1/10 abdominal cramping feels like she is having her period There were no vitals filed for this visit.    Lab orders placed from triage:   none

## 2023-10-20 ENCOUNTER — Ambulatory Visit: Attending: Obstetrics and Gynecology | Admitting: Obstetrics

## 2023-10-20 ENCOUNTER — Ambulatory Visit (HOSPITAL_BASED_OUTPATIENT_CLINIC_OR_DEPARTMENT_OTHER)

## 2023-10-20 ENCOUNTER — Ambulatory Visit (HOSPITAL_BASED_OUTPATIENT_CLINIC_OR_DEPARTMENT_OTHER): Admitting: *Deleted

## 2023-10-20 VITALS — BP 119/57 | HR 83

## 2023-10-20 DIAGNOSIS — Z3A29 29 weeks gestation of pregnancy: Secondary | ICD-10-CM | POA: Insufficient documentation

## 2023-10-20 DIAGNOSIS — O365930 Maternal care for other known or suspected poor fetal growth, third trimester, not applicable or unspecified: Secondary | ICD-10-CM | POA: Diagnosis not present

## 2023-10-20 DIAGNOSIS — O4443 Low lying placenta NOS or without hemorrhage, third trimester: Secondary | ICD-10-CM | POA: Diagnosis not present

## 2023-10-20 DIAGNOSIS — O365920 Maternal care for other known or suspected poor fetal growth, second trimester, not applicable or unspecified: Secondary | ICD-10-CM

## 2023-10-20 DIAGNOSIS — O09293 Supervision of pregnancy with other poor reproductive or obstetric history, third trimester: Secondary | ICD-10-CM | POA: Diagnosis not present

## 2023-10-20 DIAGNOSIS — O09299 Supervision of pregnancy with other poor reproductive or obstetric history, unspecified trimester: Secondary | ICD-10-CM

## 2023-10-20 LAB — GC/CHLAMYDIA PROBE AMP (~~LOC~~) NOT AT ARMC
Chlamydia: NEGATIVE
Comment: NEGATIVE
Comment: NORMAL
Neisseria Gonorrhea: NEGATIVE

## 2023-10-20 NOTE — Progress Notes (Signed)
 MFM Consult Note  Mary Ware is currently at 29 weeks and 1 day.  She has been followed due to IUGR.    She was evaluated in the MAU over the weekend due to contractions and vaginal spotting.  She received a complete course of antenatal corticosteroids at that time.    She reports that her vaginal bleeding and contractions have resolved.    She had a reactive NST today.  The total AFI was 11.68 cm.  Doppler studies of the umbilical arteries performed due to fetal growth restriction showed an elevated S/D ratio of 5.08.  There were no signs of absent or reversed end-diastolic flow noted today.  A low-lying placenta continues to be noted on today's exam.  The patient was reassured that hopefully the low-lying placenta will resolve later in her pregnancy.  Due to IUGR, we will continue to follow her with weekly fetal testing until delivery.    Should IUGR continue to be noted later in her pregnancy, delivery will be recommended at between 37 to 38 weeks.   She will return in 1 week for a growth scan, NST, and umbilical artery Doppler studies.    The patient stated that all of her questions were answered today.  A total of 20 minutes was spent counseling and coordinating the care for this patient.  Greater than 50% of the time was spent in direct face-to-face contact.

## 2023-10-20 NOTE — Procedures (Signed)
 Mary Ware 1995-05-02 [redacted]w[redacted]d  Fetus A Non-Stress Test Interpretation for 10/20/23  Indication: IUGR and Low lying placenta  Fetal Heart Rate A Mode: External Baseline Rate (A): 130 bpm Variability: Moderate Accelerations: 10 x 10, 15 x 15 Multiple birth?: No  Uterine Activity Mode: Palpation, Toco Contraction Frequency (min): UI Contraction Duration (sec): 20-30 Contraction Quality: Mild Resting Tone Palpated: Relaxed Resting Time: Adequate  Interpretation (Fetal Testing) Nonstress Test Interpretation: Reactive Comments: Dr. Ileana reviewed tracing.

## 2023-10-23 ENCOUNTER — Telehealth: Payer: Self-pay

## 2023-10-23 DIAGNOSIS — Z7189 Other specified counseling: Secondary | ICD-10-CM

## 2023-10-23 NOTE — Progress Notes (Signed)
 Complex Care Management Note  Care Guide Note 10/23/2023 Name: Mary Ware MRN: [id number] DOB: Aug 29, 1995  Mary Ware is a 28 y.o. year old female who sees Patient, No Pcp Per for primary care. I reached out to [employer] by phone today to offer complex care management services.  [name] was given information about Complex Care Management services today including:   The Complex Care Management services include support from the care team which includes your Nurse Care Manager, Clinical Social Worker, or Pharmacist.  The Complex Care Management team is here to help remove barriers to the health concerns and goals most important to you. Complex Care Management services are voluntary, and the patient may decline or stop services at any time by request to their care team member.   Complex Care Management Consent Status: Patient did not agree to participate in complex care management services at this time.  Follow up plan:    Encounter Outcome:  Patient Refused  Jeoffrey Buffalo , RMA     [facility]  [facility], [facility] Guide  Direct Dial: [phone]  Website: [email or web address]

## 2023-10-27 ENCOUNTER — Other Ambulatory Visit: Payer: Self-pay | Admitting: Obstetrics and Gynecology

## 2023-10-27 ENCOUNTER — Ambulatory Visit: Attending: Obstetrics and Gynecology | Admitting: Obstetrics and Gynecology

## 2023-10-27 ENCOUNTER — Ambulatory Visit (HOSPITAL_BASED_OUTPATIENT_CLINIC_OR_DEPARTMENT_OTHER)

## 2023-10-27 ENCOUNTER — Ambulatory Visit: Admitting: *Deleted

## 2023-10-27 ENCOUNTER — Other Ambulatory Visit: Payer: Self-pay | Admitting: *Deleted

## 2023-10-27 VITALS — BP 117/63 | HR 93

## 2023-10-27 DIAGNOSIS — Z3A30 30 weeks gestation of pregnancy: Secondary | ICD-10-CM

## 2023-10-27 DIAGNOSIS — O4443 Low lying placenta NOS or without hemorrhage, third trimester: Secondary | ICD-10-CM

## 2023-10-27 DIAGNOSIS — O09293 Supervision of pregnancy with other poor reproductive or obstetric history, third trimester: Secondary | ICD-10-CM

## 2023-10-27 DIAGNOSIS — O358XX0 Maternal care for other (suspected) fetal abnormality and damage, not applicable or unspecified: Secondary | ICD-10-CM | POA: Diagnosis not present

## 2023-10-27 DIAGNOSIS — O365920 Maternal care for other known or suspected poor fetal growth, second trimester, not applicable or unspecified: Secondary | ICD-10-CM

## 2023-10-27 DIAGNOSIS — O365930 Maternal care for other known or suspected poor fetal growth, third trimester, not applicable or unspecified: Secondary | ICD-10-CM

## 2023-10-27 DIAGNOSIS — O4453 Low lying placenta with hemorrhage, third trimester: Secondary | ICD-10-CM | POA: Insufficient documentation

## 2023-10-27 DIAGNOSIS — O09299 Supervision of pregnancy with other poor reproductive or obstetric history, unspecified trimester: Secondary | ICD-10-CM

## 2023-10-27 DIAGNOSIS — Z363 Encounter for antenatal screening for malformations: Secondary | ICD-10-CM | POA: Diagnosis not present

## 2023-10-27 NOTE — Procedures (Signed)
 Mary Ware 02/22/1995 [redacted]w[redacted]d  Fetus A Non-Stress Test Interpretation for 10/27/23  Indication: IUGR  Fetal Heart Rate A Mode: External Baseline Rate (A): 135 bpm Variability: Moderate Accelerations: 15 x 15 Multiple birth?: No  Uterine Activity Mode: Palpation, Toco Contraction Frequency (min): Occas Contraction Quality: Mild Resting Tone Palpated: Relaxed Resting Time: Adequate  Interpretation (Fetal Testing) Nonstress Test Interpretation: Reactive Comments: Dr. Arna reviewed tracing.

## 2023-10-27 NOTE — Progress Notes (Signed)
 Maternal-Fetal Medicine Consultation  Name: DILYNN MUNROE  MRN: [id number]  GA: G2P1001 [redacted]w[redacted]d   Fetal growth restriction.  On ultrasound performed 3 weeks ago, the estimated fetal weight was at the 2nd percentile and the abdominal circumference measurement at the 7th percentile. Blood pressure today at our office is 117/63 mmHg.  Ultrasound On today's ultrasound, the estimated fetal weight is at the 3rd percentile and the abdominal circumference measurement at the 11th percentile.  Interval weight gain is 357 g over 18 days.  Head circumference measurement is at between -2 and -1 SD (normal).  Amniotic fluid is normal and good fetal activity seen.  Umbilical artery Doppler showed normal forward diastolic flow.  NST is reactive.  BPP 10/10.  Placenta is posterior and is not low-lying.  Fetal growth restriction I explained the finding of severe fetal growth restriction but adequate interval growth.  I discussed the importance of weekly antenatal testing and a follow-up fetal growth assessment in 3 weeks. We will recommend timing of delivery after future fetal growth assessments.  If severe fetal growth restriction persists, we will recommend delivery at [redacted] weeks gestation. I reassured the patient that low-lying placenta has resolved.  Recommendations -Continue weekly antenatal testing till delivery.     Consultation including face-to-face (more than 50%) counseling 20 minutes.

## 2023-11-05 ENCOUNTER — Ambulatory Visit

## 2023-11-05 ENCOUNTER — Ambulatory Visit: Admitting: *Deleted

## 2023-11-05 ENCOUNTER — Ambulatory Visit: Attending: Obstetrics and Gynecology | Admitting: Obstetrics and Gynecology

## 2023-11-05 VITALS — BP 103/60

## 2023-11-05 DIAGNOSIS — Z3A31 31 weeks gestation of pregnancy: Secondary | ICD-10-CM | POA: Insufficient documentation

## 2023-11-05 DIAGNOSIS — O09293 Supervision of pregnancy with other poor reproductive or obstetric history, third trimester: Secondary | ICD-10-CM

## 2023-11-05 DIAGNOSIS — Z362 Encounter for other antenatal screening follow-up: Secondary | ICD-10-CM | POA: Insufficient documentation

## 2023-11-05 DIAGNOSIS — O09299 Supervision of pregnancy with other poor reproductive or obstetric history, unspecified trimester: Secondary | ICD-10-CM

## 2023-11-05 DIAGNOSIS — O365920 Maternal care for other known or suspected poor fetal growth, second trimester, not applicable or unspecified: Secondary | ICD-10-CM

## 2023-11-05 DIAGNOSIS — O35BXX0 Maternal care for other (suspected) fetal abnormality and damage, fetal cardiac anomalies, not applicable or unspecified: Secondary | ICD-10-CM | POA: Diagnosis not present

## 2023-11-05 DIAGNOSIS — O4443 Low lying placenta NOS or without hemorrhage, third trimester: Secondary | ICD-10-CM | POA: Diagnosis not present

## 2023-11-05 DIAGNOSIS — O4693 Antepartum hemorrhage, unspecified, third trimester: Secondary | ICD-10-CM | POA: Diagnosis not present

## 2023-11-05 DIAGNOSIS — O365930 Maternal care for other known or suspected poor fetal growth, third trimester, not applicable or unspecified: Secondary | ICD-10-CM | POA: Diagnosis not present

## 2023-11-05 NOTE — Progress Notes (Signed)
 After review, MFM consult with provider is not indicated for today  Arna Ranks, MD 11/05/2023 4:40 PM  [facility]

## 2023-11-05 NOTE — Procedures (Signed)
 Mary Ware December 29, 1995 [redacted]w[redacted]d  Fetus A Non-Stress Test Interpretation for 11/05/23  Indication: IUGR  Fetal Heart Rate A Mode: External Baseline Rate (A): 125 bpm Variability: Moderate Accelerations: 15 x 15 Decelerations: None Multiple birth?: No  Uterine Activity Mode: Palpation, Toco Contraction Frequency (min): none Resting Tone Palpated: Relaxed  Interpretation (Fetal Testing) Nonstress Test Interpretation: Reactive Overall Impression: Reassuring for gestational age Comments: Dr. Arna reviewed tracing

## 2023-11-11 ENCOUNTER — Ambulatory Visit

## 2023-11-11 ENCOUNTER — Other Ambulatory Visit

## 2023-11-11 ENCOUNTER — Ambulatory Visit: Attending: Obstetrics and Gynecology | Admitting: Obstetrics and Gynecology

## 2023-11-11 VITALS — BP 105/55

## 2023-11-11 DIAGNOSIS — O365930 Maternal care for other known or suspected poor fetal growth, third trimester, not applicable or unspecified: Secondary | ICD-10-CM

## 2023-11-11 DIAGNOSIS — O4443 Low lying placenta NOS or without hemorrhage, third trimester: Secondary | ICD-10-CM

## 2023-11-11 DIAGNOSIS — O09293 Supervision of pregnancy with other poor reproductive or obstetric history, third trimester: Secondary | ICD-10-CM | POA: Insufficient documentation

## 2023-11-11 DIAGNOSIS — Z3A32 32 weeks gestation of pregnancy: Secondary | ICD-10-CM

## 2023-11-11 DIAGNOSIS — O358XX0 Maternal care for other (suspected) fetal abnormality and damage, not applicable or unspecified: Secondary | ICD-10-CM

## 2023-11-11 NOTE — Procedures (Signed)
 Mary Ware 02-21-1995 [redacted]w[redacted]d  Fetus A Non-Stress Test Interpretation for 11/11/23  Indication: IUGR  Fetal Heart Rate A Mode: External Baseline Rate (A): 125 bpm Variability: Moderate Accelerations: 15 x 15 Decelerations: None Multiple birth?: No  Uterine Activity Mode: Palpation, Toco Contraction Frequency (min): none noted Resting Tone Palpated: Relaxed  Interpretation (Fetal Testing) Nonstress Test Interpretation: Reactive Comments: Reviewed with Dr. Arna

## 2023-11-11 NOTE — Progress Notes (Signed)
 Maternal-Fetal Medicine Consultation  Name: Mary Ware  MRN: [id number]  GA: G2P1001 [redacted]w[redacted]d   Fetal growth restriction.  On ultrasound performed 2 weeks ago, the estimated fetal weight was at the 3rd percentile.  Blood pressure today at our office is 105/55 mmHg.  Ultrasound Normal amniotic fluid.  Umbilical artery Doppler showed increased S/D ratio (greater than 95th percentile).  NST is reactive.  BPP 10/10.  I explained the significance of abnormal Doppler studies.  I reassured her that delivery is not indicated for this reason before [redacted] weeks gestation.  We will continue to monitor UA Doppler studies and BPP. We will recommend timing of delivery at follow-up scans close to [redacted] weeks gestation.   Recommendations - Fetal growth assessment next week.     Consultation including face-to-face (more than 50%) counseling 10 minutes.

## 2023-11-18 ENCOUNTER — Ambulatory Visit (HOSPITAL_BASED_OUTPATIENT_CLINIC_OR_DEPARTMENT_OTHER)

## 2023-11-18 ENCOUNTER — Ambulatory Visit

## 2023-11-18 ENCOUNTER — Ambulatory Visit: Attending: Obstetrics and Gynecology | Admitting: Maternal & Fetal Medicine

## 2023-11-18 VITALS — BP 113/67

## 2023-11-18 DIAGNOSIS — Z3A33 33 weeks gestation of pregnancy: Secondary | ICD-10-CM

## 2023-11-18 DIAGNOSIS — O09293 Supervision of pregnancy with other poor reproductive or obstetric history, third trimester: Secondary | ICD-10-CM | POA: Diagnosis not present

## 2023-11-18 DIAGNOSIS — O365910 Maternal care for other known or suspected poor fetal growth, first trimester, not applicable or unspecified: Secondary | ICD-10-CM | POA: Diagnosis not present

## 2023-11-18 DIAGNOSIS — O358XX0 Maternal care for other (suspected) fetal abnormality and damage, not applicable or unspecified: Secondary | ICD-10-CM

## 2023-11-18 DIAGNOSIS — O365930 Maternal care for other known or suspected poor fetal growth, third trimester, not applicable or unspecified: Secondary | ICD-10-CM | POA: Insufficient documentation

## 2023-11-18 DIAGNOSIS — O4443 Low lying placenta NOS or without hemorrhage, third trimester: Secondary | ICD-10-CM | POA: Diagnosis not present

## 2023-11-18 DIAGNOSIS — Z362 Encounter for other antenatal screening follow-up: Secondary | ICD-10-CM | POA: Diagnosis not present

## 2023-11-18 NOTE — Progress Notes (Signed)
   Patient information  Patient Name: Mary Ware  Patient MRN:   [id number]  Referring practice: MFM Referring Provider: [facility] ([facility])  Problem List   Patient Active Problem List   Diagnosis Date Noted   IUGR (intrauterine growth restriction) affecting care of mother 10/08/2023   Low lying placenta nos or without hemorrhage, second trimester( Placenta previa resolved) 10/08/2023   History of prior pregnancy with small for gestational age newborn 10/08/2023   History of psychosis 07/12/2016   PTSD (post-traumatic stress disorder) 07/12/2016    Maternal Fetal medicine Consult  Mary Ware is a 28 y.o. G2P1001 at [redacted]w[redacted]d here for ultrasound and consultation. Mary Ware is doing well today with no acute concerns. Today we focused on the following:   FGR: The most recent growth showed growth at the 3.1%/11% overall/AC.  The overall weight is driven largely by the short femurs which is likely constitutional.  Umbilical artery Dopplers are normal today.  We discussed the importance of future ultrasounds and delivery timing likely around [redacted] weeks gestation or sooner if indicated. Fetal movement precautions given.  Sonographic findings Single intrauterine pregnancy at 33w 2d. Fetal cardiac activity:  Observed and appears normal. Presentation: Cephalic. Interval fetal anatomy appears normal. Fetal biometry shows the estimated fetal weight at the 1.7 percentile and the abdominal circumference at the 11 percentile.  Amniotic fluid: Subjectively upper-normal.  MVP: 7.95 cm. Placenta: Posterior. Umbilical artery dopplers findings: -S/D: 3.12 which are normal at this gestational age.  -Absent end-diastolic flow: No.  -Reversed end-diastolic flow:  No. BPP 10/10.   There are limitations of prenatal ultrasound such as the inability to detect certain abnormalities due to poor visualization. Various factors such as fetal position, gestational age and maternal body  habitus may increase the difficulty in visualizing the fetal anatomy.    Recommendations - Continue weekly UA dopplers and antenatal testing until delivery. - Serial growth US  every 3 weeks until delivery. - Delivery likely around 37 weeks or sooner if indicated.   Review of Systems: A review of systems was performed and was negative except per HPI   Vitals and Physical Exam    11/18/2023    8:25 AM 11/11/2023    1:15 PM 11/05/2023    2:27 PM  Vitals with BMI  Systolic 113 105 896  Diastolic 67 55 60    Sitting comfortably on the sonogram table Nonlabored breathing Normal rate and rhythm Abdomen is nontender  Past pregnancies OB History  Gravida Para Term Preterm AB Living  2 1 1   1   SAB IAB Ectopic Multiple Live Births      1    # Outcome Date GA Lbr Len/2nd Weight Sex Type Anes PTL Lv  2 Current           1 Term 07/26/13 [redacted]w[redacted]d 11:44 / 03:32 5 lb 12 oz (2.608 kg) M Vag-Vacuum EPI  LIV     I spent 20 minutes reviewing the patients chart, including labs and images as well as counseling the patient about her medical conditions. Greater than 50% of the time was spent in direct face-to-face patient counseling.  Delora Smaller  MFM, [place]   11/18/2023  9:00 AM

## 2023-11-18 NOTE — Procedures (Signed)
 Mary Ware Mar 12, 1995 [redacted]w[redacted]d  Fetus A Non-Stress Test Interpretation for 11/18/23  Indication: IUGR  Fetal Heart Rate A Mode: External Baseline Rate (A): 130 bpm Variability: Moderate Accelerations: 15 x 15 Decelerations: None Multiple birth?: No  Uterine Activity Mode: Palpation, Toco Contraction Frequency (min): none noted Resting Tone Palpated: Relaxed  Interpretation (Fetal Testing) Nonstress Test Interpretation: Reactive Comments: Reviewed with Dr. William

## 2023-11-25 ENCOUNTER — Ambulatory Visit

## 2023-11-25 ENCOUNTER — Ambulatory Visit: Attending: Obstetrics and Gynecology | Admitting: Obstetrics

## 2023-11-25 ENCOUNTER — Ambulatory Visit (HOSPITAL_BASED_OUTPATIENT_CLINIC_OR_DEPARTMENT_OTHER)

## 2023-11-25 VITALS — BP 126/86

## 2023-11-25 DIAGNOSIS — O4443 Low lying placenta NOS or without hemorrhage, third trimester: Secondary | ICD-10-CM | POA: Diagnosis not present

## 2023-11-25 DIAGNOSIS — Z3A34 34 weeks gestation of pregnancy: Secondary | ICD-10-CM | POA: Diagnosis not present

## 2023-11-25 DIAGNOSIS — O365930 Maternal care for other known or suspected poor fetal growth, third trimester, not applicable or unspecified: Secondary | ICD-10-CM

## 2023-11-25 DIAGNOSIS — O09293 Supervision of pregnancy with other poor reproductive or obstetric history, third trimester: Secondary | ICD-10-CM | POA: Insufficient documentation

## 2023-11-25 DIAGNOSIS — O358XX0 Maternal care for other (suspected) fetal abnormality and damage, not applicable or unspecified: Secondary | ICD-10-CM | POA: Insufficient documentation

## 2023-11-25 DIAGNOSIS — O4440 Low lying placenta NOS or without hemorrhage, unspecified trimester: Secondary | ICD-10-CM | POA: Diagnosis not present

## 2023-11-25 DIAGNOSIS — Z362 Encounter for other antenatal screening follow-up: Secondary | ICD-10-CM | POA: Insufficient documentation

## 2023-11-25 NOTE — Progress Notes (Signed)
 MFM Consult Note  Mary Ware is currently at 34 weeks and 2 days.  She has been followed due to severe IUGR.    She denies any problems since her last exam and reports feeling fetal movements throughout the day.  A BPP performed today was 10/10 with a reactive NST.  The total AFI was 14.36 cm.  Doppler studies of the umbilical arteries performed due to fetal growth restriction showed a normal S/D ratio of 3.3.  There were no signs of absent or reversed end-diastolic flow noted today.  Due to severe IUGR, we will continue to follow her with weekly fetal testing and umbilical artery Doppler studies until delivery.  She will return in 1 week for another BPP/NST and umbilical artery Doppler study.     Delivery is recommended at around 37 weeks.    The patient stated that all of her questions were answered today.  A total of 10 minutes was spent counseling and coordinating the care for this patient.  Greater than 50% of the time was spent in direct face-to-face contact.

## 2023-11-26 NOTE — Procedures (Signed)
 Mary Ware [birth year] [redacted]w[redacted]d  Fetus A Non-Stress Test Interpretation for 11/26/23  Indication: IUGR  Fetal Heart Rate A Mode: External Baseline Rate (A): 125 bpm Variability: Moderate Accelerations: 15 x 15 Decelerations: None Multiple birth?: No  Uterine Activity Mode: Toco, Palpation Contraction Frequency (min): none noted Resting Tone Palpated: Relaxed  Interpretation (Fetal Testing) Nonstress Test Interpretation: Reactive Comments: Reviewed with Dr. Ileana

## 2023-12-02 ENCOUNTER — Ambulatory Visit: Attending: Obstetrics and Gynecology | Admitting: Obstetrics

## 2023-12-02 ENCOUNTER — Ambulatory Visit (HOSPITAL_BASED_OUTPATIENT_CLINIC_OR_DEPARTMENT_OTHER)

## 2023-12-02 ENCOUNTER — Ambulatory Visit

## 2023-12-02 ENCOUNTER — Other Ambulatory Visit: Payer: Self-pay | Admitting: Obstetrics and Gynecology

## 2023-12-02 VITALS — BP 114/59

## 2023-12-02 DIAGNOSIS — O4443 Low lying placenta NOS or without hemorrhage, third trimester: Secondary | ICD-10-CM

## 2023-12-02 DIAGNOSIS — O365930 Maternal care for other known or suspected poor fetal growth, third trimester, not applicable or unspecified: Secondary | ICD-10-CM | POA: Diagnosis not present

## 2023-12-02 DIAGNOSIS — Z3A35 35 weeks gestation of pregnancy: Secondary | ICD-10-CM | POA: Insufficient documentation

## 2023-12-02 DIAGNOSIS — O358XX0 Maternal care for other (suspected) fetal abnormality and damage, not applicable or unspecified: Secondary | ICD-10-CM | POA: Insufficient documentation

## 2023-12-02 DIAGNOSIS — O09293 Supervision of pregnancy with other poor reproductive or obstetric history, third trimester: Secondary | ICD-10-CM | POA: Diagnosis not present

## 2023-12-03 NOTE — Progress Notes (Signed)
 MFM Consult Note  Mary Ware is currently at 35 weeks and 2 days.  She has been followed due to severe IUGR.    She denies any problems since her last exam and reports feeling fetal movements throughout the day.  A BPP performed today was 8/8.  The total AFI was 15.14 cm.  Doppler studies of the umbilical arteries performed due to fetal growth restriction showed an elevated S/D ratio of 4.45. There were no signs of absent or reversed end-diastolic flow noted today.  Vertex presentation.  Due to severe IUGR, she already has an induction of labor scheduled on December 16, 2023 (at around 37 weeks).  She will return in 1 week for another BPP/NST, growth scan, and umbilical artery Doppler study.     The patient stated that all of her questions were answered today.  A total of 10 minutes was spent counseling and coordinating the care for this patient.  Greater than 50% of the time was spent in direct face-to-face contact.

## 2023-12-08 ENCOUNTER — Inpatient Hospital Stay (HOSPITAL_COMMUNITY)
Admission: AD | Admit: 2023-12-08 | Discharge: 2023-12-11 | DRG: 806 | Disposition: A | Discharge status: Home / Self Care | Attending: Obstetrics and Gynecology | Admitting: Obstetrics and Gynecology

## 2023-12-08 ENCOUNTER — Encounter (HOSPITAL_COMMUNITY): Payer: Self-pay | Admitting: Obstetrics and Gynecology

## 2023-12-08 ENCOUNTER — Other Ambulatory Visit: Payer: Self-pay

## 2023-12-08 DIAGNOSIS — Z833 Family history of diabetes mellitus: Secondary | ICD-10-CM

## 2023-12-08 DIAGNOSIS — O365930 Maternal care for other known or suspected poor fetal growth, third trimester, not applicable or unspecified: Secondary | ICD-10-CM | POA: Diagnosis not present

## 2023-12-08 DIAGNOSIS — Z87891 Personal history of nicotine dependence: Secondary | ICD-10-CM | POA: Diagnosis not present

## 2023-12-08 DIAGNOSIS — Z8249 Family history of ischemic heart disease and other diseases of the circulatory system: Secondary | ICD-10-CM

## 2023-12-08 DIAGNOSIS — O9902 Anemia complicating childbirth: Secondary | ICD-10-CM | POA: Diagnosis not present

## 2023-12-08 DIAGNOSIS — O365990 Maternal care for other known or suspected poor fetal growth, unspecified trimester, not applicable or unspecified: Secondary | ICD-10-CM | POA: Diagnosis present

## 2023-12-08 DIAGNOSIS — F1290 Cannabis use, unspecified, uncomplicated: Secondary | ICD-10-CM | POA: Diagnosis present

## 2023-12-08 DIAGNOSIS — O99324 Drug use complicating childbirth: Secondary | ICD-10-CM | POA: Diagnosis present

## 2023-12-08 DIAGNOSIS — O4593 Premature separation of placenta, unspecified, third trimester: Principal | ICD-10-CM | POA: Diagnosis present

## 2023-12-08 DIAGNOSIS — Z3A36 36 weeks gestation of pregnancy: Secondary | ICD-10-CM | POA: Diagnosis not present

## 2023-12-08 DIAGNOSIS — O4693 Antepartum hemorrhage, unspecified, third trimester: Secondary | ICD-10-CM | POA: Diagnosis not present

## 2023-12-08 DIAGNOSIS — K219 Gastro-esophageal reflux disease without esophagitis: Secondary | ICD-10-CM | POA: Diagnosis present

## 2023-12-08 DIAGNOSIS — O9962 Diseases of the digestive system complicating childbirth: Secondary | ICD-10-CM | POA: Diagnosis not present

## 2023-12-08 DIAGNOSIS — O4590 Premature separation of placenta, unspecified, unspecified trimester: Secondary | ICD-10-CM | POA: Diagnosis present

## 2023-12-08 LAB — DIC (DISSEMINATED INTRAVASCULAR COAGULATION)PANEL
D-Dimer, Quant: 2.22 ug{FEU}/mL — ABNORMAL HIGH (ref 0.00–0.50)
Fibrinogen: 350 mg/dL (ref 210–475)
INR: 1 (ref 0.8–1.2)
Platelets: 208 K/uL (ref 150–400)
Prothrombin Time: 13.2 s (ref 11.4–15.2)
Smear Review: NONE SEEN
aPTT: 32 s (ref 24–36)

## 2023-12-08 LAB — CBC
HCT: 36.9 % (ref 36.0–46.0)
Hemoglobin: 13 g/dL (ref 12.0–15.0)
MCH: 32.9 pg (ref 26.0–34.0)
MCHC: 35.2 g/dL (ref 30.0–36.0)
MCV: 93.4 fL (ref 80.0–100.0)
Platelets: 213 K/uL (ref 150–400)
RBC: 3.95 MIL/uL (ref 3.87–5.11)
RDW: 12.3 % (ref 11.5–15.5)
WBC: 11.8 K/uL — ABNORMAL HIGH (ref 4.0–10.5)
nRBC: 0 % (ref 0.0–0.2)

## 2023-12-08 LAB — COMPREHENSIVE METABOLIC PANEL WITH GFR
ALT: 27 U/L (ref 0–44)
AST: 34 U/L (ref 15–41)
Albumin: 2.8 g/dL — ABNORMAL LOW (ref 3.5–5.0)
Alkaline Phosphatase: 408 U/L — ABNORMAL HIGH (ref 38–126)
Anion gap: 10 (ref 5–15)
BUN: 5 mg/dL — ABNORMAL LOW (ref 6–20)
CO2: 22 mmol/L (ref 22–32)
Calcium: 8.2 mg/dL — ABNORMAL LOW (ref 8.9–10.3)
Chloride: 101 mmol/L (ref 98–111)
Creatinine, Ser: 0.45 mg/dL (ref 0.44–1.00)
GFR, Estimated: >60 mL/min (ref >60)
Glucose, Bld: 85 mg/dL (ref 70–99)
Potassium: 3.2 mmol/L — ABNORMAL LOW (ref 3.5–5.1)
Sodium: 133 mmol/L — ABNORMAL LOW (ref 135–145)
Total Bilirubin: 0.4 mg/dL (ref 0.0–1.2)
Total Protein: 5.7 g/dL — ABNORMAL LOW (ref 6.5–8.1)

## 2023-12-08 MED ORDER — ONDANSETRON HCL 4 MG/2ML IJ SOLN: 4.0000 mg | Freq: Four times a day (QID) | INTRAMUSCULAR | Status: DC | PRN

## 2023-12-08 MED ORDER — OXYCODONE-ACETAMINOPHEN 5-325 MG PO TABS: 2.0000 | ORAL_TABLET | ORAL | Status: DC | PRN

## 2023-12-08 MED ORDER — OXYTOCIN-SODIUM CHLORIDE 30-0.9 UT/500ML-% IV SOLN: 2.5000 [IU]/h | INTRAVENOUS | Status: DC

## 2023-12-08 MED ORDER — OXYTOCIN BOLUS FROM INFUSION
333.0000 mL | Freq: Once | INTRAVENOUS | Status: AC
  Administered 2023-12-09: 333 mL via INTRAVENOUS

## 2023-12-08 MED ORDER — LACTATED RINGERS IV SOLN: INTRAVENOUS | Status: DC

## 2023-12-08 MED ORDER — FLEET ENEMA RE ENEM: 1.0000 | ENEMA | RECTAL | Status: DC | PRN

## 2023-12-08 MED ORDER — SODIUM CHLORIDE 0.9 % IV SOLN
5.0000 10*6.[IU] | Freq: Once | INTRAVENOUS | Status: AC
  Administered 2023-12-08: 5 10*6.[IU] via INTRAVENOUS
  Filled 2023-12-08: qty 5

## 2023-12-08 MED ORDER — OXYCODONE-ACETAMINOPHEN 5-325 MG PO TABS: 1.0000 | ORAL_TABLET | ORAL | Status: DC | PRN

## 2023-12-08 MED ORDER — PENICILLIN G POT IN DEXTROSE 60000 UNIT/ML IV SOLN
3.0000 10*6.[IU] | INTRAVENOUS | Status: DC
  Administered 2023-12-09 (×3): 3 10*6.[IU] via INTRAVENOUS
  Filled 2023-12-08 (×3): qty 50

## 2023-12-08 MED ORDER — TERBUTALINE SULFATE 1 MG/ML IJ SOLN
INTRAMUSCULAR | Status: AC
  Filled 2023-12-08: qty 1

## 2023-12-08 MED ORDER — LIDOCAINE HCL (PF) 1 % IJ SOLN: 30.0000 mL | INTRAMUSCULAR | Status: DC | PRN

## 2023-12-08 MED ORDER — LACTATED RINGERS IV SOLN: 500.0000 mL | INTRAVENOUS | Status: DC | PRN

## 2023-12-08 MED ORDER — ACETAMINOPHEN 325 MG PO TABS: 650.0000 mg | ORAL_TABLET | ORAL | Status: DC | PRN

## 2023-12-08 MED ORDER — SOD CITRATE-CITRIC ACID 500-334 MG/5ML PO SOLN: 30.0000 mL | ORAL | Status: DC | PRN

## 2023-12-08 NOTE — MAU Provider Note (Signed)
 Obstetric Attending MAU Note  Chief Complaint:  Vaginal Bleeding   Event Date/Time   First Provider Initiated Contact with Patient 12/08/23 2123     HPI: Mary Ware is a 28 y.o. G2P1001 at [redacted]w[redacted]d who presents to maternity admissions reporting heavy vaginal bleeding. She states it started 40 minutes prior to arrival and noted she has since soaked 2 pads.  Denies contractions, leakage of fluid. Good fetal movement.   Pregnancy Course: Receives care at [facility] complicated by FGR last EFW 3.7 %, with normal fluid. Low lying placenta resolved  Patient Active Problem List   Diagnosis Date Noted   IUGR (intrauterine growth restriction) affecting care of mother 10/08/2023   Low lying placenta nos or without hemorrhage, second trimester( Placenta previa resolved) 10/08/2023   History of prior pregnancy with small for gestational age newborn 10/08/2023   History of psychosis 07/12/2016   PTSD (post-traumatic stress disorder) 07/12/2016    Past Medical History:  Diagnosis Date   Cholecystitis 04/27/2015   GERD (gastroesophageal reflux disease)    Heart murmur    h/o r/t pulmonary valve stenosis   Intractable vomiting    Kidney stones    Pancreatitis, acute 06/02/2015   Pulmonary valve stenosis    AS A CHILD-PT HAS OUTGROWN AND REVERSED ITSELF PER PT AND DOES NOT SEE A CARDIOLOGIST-ASYMPTOMATIC    OB History  Gravida Para Term Preterm AB Living  2 1 1   1   SAB IAB Ectopic Multiple Live Births      1    # Outcome Date GA Lbr Len/2nd Weight Sex Type Anes PTL Lv  2 Current           1 Term 07/26/13 [redacted]w[redacted]d 11:44 / 03:32 2608 g M Vag-Vacuum EPI  LIV    Past Surgical History:  Procedure Laterality Date   CHOLECYSTECTOMY N/A 04/27/2015   Procedure: LAPAROSCOPIC CHOLECYSTECTOMY WITH INTRAOPERATIVE CHOLANGIOGRAM;  Surgeon: Larinda Unknown Sharps, MD;  Location: [facility] ORS;  Service: General;  Laterality: N/A;   WISDOM TOOTH EXTRACTION      Family History: Family History  Problem  Relation Age of Onset   Cancer Mother        lung/mets to brain   COPD Mother    Hypertension Mother    Diabetes Mother    Emphysema Mother    Other Father        unknonwn, doesn't go    Social History: Social History   Tobacco Use   Smoking status: Former    Current packs/day: 0.50    Types: Cigars, Cigarettes   Smokeless tobacco: Never   Tobacco comments:    Quit Feb 2025  Vaping Use   Vaping status: Never Used  Substance Use Topics   Alcohol use: No   Drug use: Not Currently    Types: Benzodiazepines, Marijuana    Comment: + UDS in 2016 for THC (tetrahydrocannabinol);nothing since +preg test 2025    Allergies:  Allergies  Allergen Reactions   Coconut Fatty Acid Anaphylaxis    Medications Prior to Admission  Medication Sig Dispense Refill Last Dose/Taking   Prenatal Vit-Fe Fumarate-FA (PRENATAL VITAMINS PO) Take 1 tablet by mouth daily.   12/08/2023   VITAMIN D PO Take by mouth.   12/08/2023   omeprazole (PRILOSEC) 20 MG capsule Take 20 mg by mouth daily.   Unknown   ondansetron  (ZOFRAN ) 4 MG tablet Take 4 mg by mouth every 8 (eight) hours as needed for nausea or vomiting.   Unknown  ROS: Pertinent findings in history of present illness.  Physical Exam  Blood pressure 116/67, pulse 70, resp. rate 18, last menstrual period 03/29/2023. CONSTITUTIONAL: Well-developed, well-nourished female in no acute distress.  HENT:  Normocephalic, atraumatic EYES: Conjunctivae and EOM are normal. No scleral icterus.  NECK: Normal range of motion, supple, no masses SKIN: Skin is warm and dry. No rash noted. Not diaphoretic. No erythema. No pallor. NEUROLGIC: Alert and oriented to person, place, and time. CARDIOVASCULAR: Normal heart rate noted, regular rhythm RESPIRATORY: Effort and breath sounds normal, no problems with respiration noted ABDOMEN: Soft, nontender, nondistended, gravid appropriate for gestational age MUSCULOSKELETAL: Normal range of motion. No edema and no  tenderness. 2+ distal pulses.  SPECULUM EXAM: NEFG, dark red blood in vault, partially filled speculum.  SVE: 1/50/-2  FHT:  Baseline 120 , moderate variability, accelerations present, no decelerations Contractions: q 1-2 mins   Labs: Pending  Imaging:  None  MAU Course: IV x 2 started Labs drawn Concern for placental abruption 36 weeks with IUGR, but cannot transfer due to abruptio Care discussed with Dr. Rosalva Advised admission and delivery given GA and amount of bleeding FHR is reassuring for now Check labs and correct any coagulopathy. Have blood available.  Assessment: 1. [redacted] weeks gestation of pregnancy   2. Fetal growth restriction antepartum   3. Placental abruption in third trimester     Plan: Care turned over to A. Joshua, CNM, per Dr. Rosalva request To remain on monitor here.  Fredirick Glenys RAMAN, MD 12/08/2023 9:36 PM

## 2023-12-08 NOTE — H&P (Addendum)
 OB ADMISSION/ HISTORY & PHYSICAL:  Admission Date: 12/08/2023  8:20 PM  Admit Diagnosis: Heavy bleeding, cramping    Mary Ware is a 28 y.o. female G2P1001 at [redacted]w[redacted]d presenting for vaginal bleeding. States heavy bleeding 40 minutes prior to arrival to hospital. Reports some cramping, rates 2/10 on pain scale. Endorses + fetal movement.   Prenatal History: G2P1001   EDC: 01/04/2024, by Other Basis   Prenatal course complicated by: Vaginal bleeding in 3T, presents to MAU with moderate bleeding, delivery indicated in the setting if FGR <3% and suspected placental abruption FGR, MFM following, last EFW 1.7% at [redacted]w[redacted]d BMZ given 9/13 and 9/14 Low lying placenta resolved  Prenatal Labs: ABO, Rh:   A POS Antibody: PENDING (11/03 2148) Rubella:   Immune RPR:   Non-reactive HBsAg:   Negative HIV:   Negative GBS:   Unknown, preterm 1 hr Glucola : Unable to tolerate x 2 Genetic Screening: Low risk Panorama XX Ultrasound: normal XX anatomy, posterior placenta, EFW 1.7% at [redacted]w[redacted]d    Maternal Diabetes: No Genetic Screening: Normal Maternal Ultrasounds/Referrals: Normal Fetal Ultrasounds or other Referrals:  Referred to Materal Fetal Medicine  Maternal Substance Abuse:  No Significant Maternal Medications:  None Significant Maternal Lab Results:  Other:  GBS unknown Other Comments:  None  Medical / Surgical History : Past medical history:  Past Medical History:  Diagnosis Date   Cholecystitis 04/27/2015   GERD (gastroesophageal reflux disease)    Heart murmur    h/o r/t pulmonary valve stenosis   Intractable vomiting    Kidney stones    Pancreatitis, acute 06/02/2015   Pulmonary valve stenosis    AS A CHILD-PT HAS OUTGROWN AND REVERSED ITSELF PER PT AND DOES NOT SEE A CARDIOLOGIST-ASYMPTOMATIC    Past surgical history:  Past Surgical History:  Procedure Laterality Date   CHOLECYSTECTOMY N/A 04/27/2015   Procedure: LAPAROSCOPIC CHOLECYSTECTOMY WITH INTRAOPERATIVE  CHOLANGIOGRAM;  Surgeon: Larinda Unknown Sharps, MD;  Location: [facility] ORS;  Service: General;  Laterality: N/A;   WISDOM TOOTH EXTRACTION      Family History:  Family History  Problem Relation Age of Onset   Cancer Mother        lung/mets to brain   COPD Mother    Hypertension Mother    Diabetes Mother    Emphysema Mother    Other Father        unknonwn, doesn't go    Social History:  reports that she has quit smoking. Her smoking use included cigars and cigarettes. She has never used smokeless tobacco. She reports that she does not currently use drugs after having used the following drugs: Benzodiazepines and Marijuana. She reports that she does not drink alcohol.  Allergies: Coconut fatty acid   Current Medications at time of admission:  Medications Prior to Admission  Medication Sig Dispense Refill Last Dose/Taking   Prenatal Vit-Fe Fumarate-FA (PRENATAL VITAMINS PO) Take 1 tablet by mouth daily.   12/08/2023   VITAMIN D PO Take by mouth.   12/08/2023   omeprazole (PRILOSEC) 20 MG capsule Take 20 mg by mouth daily.   Unknown   ondansetron  (ZOFRAN ) 4 MG tablet Take 4 mg by mouth every 8 (eight) hours as needed for nausea or vomiting.   Unknown    Review of Systems: Review of Systems  Psychiatric/Behavioral:  The patient is nervous/anxious.   All other systems reviewed and are negative.  Physical Exam: Vital signs and nursing notes reviewed.  Patient Vitals for the past 24 hrs:  BP Pulse Resp  12/08/23 2109 116/67 70 --  12/08/23 2042 121/72 76 18    General: AAO x 3, NAD Heart: RRR Lungs:CTAB Abdomen: Gravid, NT Extremities: no edema SVE:   1/50/-2, per Dr. Fredirick  FHR: 125BPM, moderate variability, + accels, no decels TOCO: Contractions q 2-3 minutes  Labs:   Recent Labs    12/08/23 2148  WBC 11.8*  HGB 13.0  HCT 36.9  PLT 208  213   Assessment/Plan: 28 y.o. G2P1001 at [redacted]w[redacted]d, moderate vaginal bleeding, suspected placental abruption, coags pending, Hgb stable  at 13.0 FGR, EFW 1.7% at [redacted]w[redacted]d, elevated Dopplers, no signs of absent or reverse flow BMZ given 9/13 and 9/14 GBS unknown due to prematurity, will plan PCN   Fetal wellbeing - FHT category 1 EFW SGA 4-5lbs  Labor: Plan expectant management for 4-6 hours, consider Foley balloon or low dose Pitocin  if no change in SVE, AROM prn  GBS unknown, will treat for prematurity Rubella immune Rh positive  Pain control: desires epidural Analgesia/anesthesia PRN  Anticipated MOD: NSVD  NICU is full, discussed with patient that baby may need to be transferred if baby goes to NICU, patient verbalizes understanding.   POC discussed with patient and support team, all questions answered.  Dr. Rosalva notified of admission/plan of care.  Alan MARLA Molt CNM, MSN 12/08/2023, 10:26 PM

## 2023-12-08 NOTE — MAU Note (Signed)
 Mary Ware is a 28 y.o. at [redacted]w[redacted]d here in MAU reporting: she was at the [employer] and felt something wet rundown her leg. Saw it was blood not felt baby move much on the car ride over.  Blood soaked through pants on on underpad on chair  LMP:  Onset of complaint: Pain score: 2  Vitals:   12/08/23 2042  BP: 121/72  Pulse: 76  Resp: 18     FHT: 143  Lab orders placed from triage:

## 2023-12-09 ENCOUNTER — Ambulatory Visit

## 2023-12-09 ENCOUNTER — Inpatient Hospital Stay (HOSPITAL_COMMUNITY): Admitting: Anesthesiology

## 2023-12-09 ENCOUNTER — Other Ambulatory Visit

## 2023-12-09 ENCOUNTER — Encounter (HOSPITAL_COMMUNITY): Payer: Self-pay | Admitting: Obstetrics and Gynecology

## 2023-12-09 DIAGNOSIS — Z3A00 Weeks of gestation of pregnancy not specified: Secondary | ICD-10-CM | POA: Diagnosis not present

## 2023-12-09 DIAGNOSIS — O99344 Other mental disorders complicating childbirth: Secondary | ICD-10-CM | POA: Diagnosis not present

## 2023-12-09 DIAGNOSIS — O4590 Premature separation of placenta, unspecified, unspecified trimester: Secondary | ICD-10-CM | POA: Diagnosis present

## 2023-12-09 DIAGNOSIS — Z3A36 36 weeks gestation of pregnancy: Secondary | ICD-10-CM | POA: Diagnosis not present

## 2023-12-09 DIAGNOSIS — F419 Anxiety disorder, unspecified: Secondary | ICD-10-CM | POA: Diagnosis not present

## 2023-12-09 LAB — CBC
HCT: 32 % — ABNORMAL LOW (ref 36.0–46.0)
Hemoglobin: 11.3 g/dL — ABNORMAL LOW (ref 12.0–15.0)
MCH: 33.1 pg (ref 26.0–34.0)
MCHC: 35.3 g/dL (ref 30.0–36.0)
MCV: 93.8 fL (ref 80.0–100.0)
Platelets: 179 K/uL (ref 150–400)
RBC: 3.41 MIL/uL — ABNORMAL LOW (ref 3.87–5.11)
RDW: 12.5 % (ref 11.5–15.5)
WBC: 13 K/uL — ABNORMAL HIGH (ref 4.0–10.5)
nRBC: 0 % (ref 0.0–0.2)

## 2023-12-09 LAB — RAPID URINE DRUG SCREEN, HOSP PERFORMED
Amphetamines: NOT DETECTED
Barbiturates: NOT DETECTED
Benzodiazepines: NOT DETECTED
Cocaine: NOT DETECTED
Opiates: NOT DETECTED
Tetrahydrocannabinol: POSITIVE — AB

## 2023-12-09 LAB — RPR: RPR Ser Ql: NONREACTIVE

## 2023-12-09 LAB — PREPARE RBC (CROSSMATCH)

## 2023-12-09 LAB — ABO/RH: ABO/RH(D): A POS

## 2023-12-09 MED ORDER — PHENYLEPHRINE 80 MCG/ML (10ML) SYRINGE FOR IV PUSH (FOR BLOOD PRESSURE SUPPORT): 80.0000 ug | PREFILLED_SYRINGE | INTRAVENOUS | Status: DC | PRN

## 2023-12-09 MED ORDER — TRANEXAMIC ACID-NACL 1000-0.7 MG/100ML-% IV SOLN: 1000.0000 mg | INTRAVENOUS | Status: DC

## 2023-12-09 MED ORDER — SENNOSIDES-DOCUSATE SODIUM 8.6-50 MG PO TABS
2.0000 | ORAL_TABLET | Freq: Every day | ORAL | Status: DC
  Administered 2023-12-10 – 2023-12-11 (×2): 2 via ORAL
  Filled 2023-12-09 (×2): qty 2

## 2023-12-09 MED ORDER — WITCH HAZEL-GLYCERIN EX PADS: 1.0000 | MEDICATED_PAD | CUTANEOUS | Status: DC | PRN

## 2023-12-09 MED ORDER — DIBUCAINE (PERIANAL) 1 % EX OINT: 1.0000 | TOPICAL_OINTMENT | CUTANEOUS | Status: DC | PRN

## 2023-12-09 MED ORDER — BENZOCAINE-MENTHOL 20-0.5 % EX AERO: 1.0000 | INHALATION_SPRAY | CUTANEOUS | Status: DC | PRN

## 2023-12-09 MED ORDER — LIDOCAINE HCL (PF) 1 % IJ SOLN
INTRAMUSCULAR | Status: DC | PRN
Start: 2023-12-09 — End: 2023-12-09
  Administered 2023-12-09: 4 mL via EPIDURAL
  Administered 2023-12-09: 5 mL via EPIDURAL

## 2023-12-09 MED ORDER — DIPHENHYDRAMINE HCL 25 MG PO CAPS: 25.0000 mg | ORAL_CAPSULE | Freq: Four times a day (QID) | ORAL | Status: DC | PRN

## 2023-12-09 MED ORDER — OXYTOCIN-SODIUM CHLORIDE 30-0.9 UT/500ML-% IV SOLN
1.0000 m[IU]/min | INTRAVENOUS | Status: DC
  Administered 2023-12-09: 1 m[IU]/min via INTRAVENOUS
  Filled 2023-12-09: qty 500

## 2023-12-09 MED ORDER — EPHEDRINE 5 MG/ML INJ
10.0000 mg | INTRAVENOUS | Status: DC | PRN
  Administered 2023-12-09: 10 mg via INTRAVENOUS

## 2023-12-09 MED ORDER — EPHEDRINE 5 MG/ML INJ
10.0000 mg | INTRAVENOUS | Status: DC | PRN
  Filled 2023-12-09: qty 5

## 2023-12-09 MED ORDER — SIMETHICONE 80 MG PO CHEW: 80.0000 mg | CHEWABLE_TABLET | ORAL | Status: DC | PRN

## 2023-12-09 MED ORDER — TRANEXAMIC ACID-NACL 1000-0.7 MG/100ML-% IV SOLN
INTRAVENOUS | Status: AC
  Administered 2023-12-09: 1000 mg
  Filled 2023-12-09: qty 100

## 2023-12-09 MED ORDER — SODIUM CHLORIDE 0.9% IV SOLUTION: Freq: Once | INTRAVENOUS | Status: DC

## 2023-12-09 MED ORDER — LACTATED RINGERS IV SOLN: 500.0000 mL | Freq: Once | INTRAVENOUS | Status: DC

## 2023-12-09 MED ORDER — ACETAMINOPHEN 325 MG PO TABS: 650.0000 mg | ORAL_TABLET | ORAL | Status: DC | PRN

## 2023-12-09 MED ORDER — TETANUS-DIPHTH-ACELL PERTUSSIS 5-2-15.5 LF-MCG/0.5 IM SUSP
0.5000 mL | Freq: Once | INTRAMUSCULAR | Status: DC
  Filled 2023-12-09: qty 0.5

## 2023-12-09 MED ORDER — METHYLERGONOVINE MALEATE 0.2 MG/ML IJ SOLN
INTRAMUSCULAR | Status: AC
  Filled 2023-12-09: qty 1

## 2023-12-09 MED ORDER — TERBUTALINE SULFATE 1 MG/ML IJ SOLN
0.2500 mg | Freq: Once | INTRAMUSCULAR | Status: AC
  Administered 2023-12-08: 0.25 mg via SUBCUTANEOUS

## 2023-12-09 MED ORDER — FENTANYL-BUPIVACAINE-NACL 0.5-0.125-0.9 MG/250ML-% EP SOLN
12.0000 mL/h | EPIDURAL | Status: DC | PRN
  Administered 2023-12-09: 12 mL/h via EPIDURAL
  Filled 2023-12-09: qty 250

## 2023-12-09 MED ORDER — IBUPROFEN 600 MG PO TABS
600.0000 mg | ORAL_TABLET | Freq: Four times a day (QID) | ORAL | Status: DC
  Administered 2023-12-10 – 2023-12-11 (×7): 600 mg via ORAL
  Filled 2023-12-09 (×8): qty 1

## 2023-12-09 MED ORDER — ONDANSETRON HCL 4 MG/2ML IJ SOLN: 4.0000 mg | INTRAMUSCULAR | Status: DC | PRN

## 2023-12-09 MED ORDER — DIPHENHYDRAMINE HCL 50 MG/ML IJ SOLN: 12.5000 mg | INTRAMUSCULAR | Status: DC | PRN

## 2023-12-09 MED ORDER — ZOLPIDEM TARTRATE 5 MG PO TABS: 5.0000 mg | ORAL_TABLET | Freq: Every evening | ORAL | Status: DC | PRN

## 2023-12-09 MED ORDER — COCONUT OIL OIL: 1.0000 | TOPICAL_OIL | Status: DC | PRN

## 2023-12-09 MED ORDER — TERBUTALINE SULFATE 1 MG/ML IJ SOLN: 0.2500 mg | Freq: Once | INTRAMUSCULAR | Status: DC | PRN

## 2023-12-09 MED ORDER — ONDANSETRON HCL 4 MG PO TABS: 4.0000 mg | ORAL_TABLET | ORAL | Status: DC | PRN

## 2023-12-09 MED ORDER — PRENATAL MULTIVITAMIN CH
1.0000 | ORAL_TABLET | Freq: Every day | ORAL | Status: DC
  Administered 2023-12-10 – 2023-12-11 (×2): 1 via ORAL
  Filled 2023-12-09 (×2): qty 1

## 2023-12-09 MED ORDER — EPHEDRINE 5 MG/ML INJ: 10.0000 mg | INTRAVENOUS | Status: DC | PRN

## 2023-12-09 NOTE — Anesthesia Procedure Notes (Addendum)
 Epidural Patient location during procedure: OB Start time: 12/09/2023 8:16 AM End time: 12/09/2023 8:19 AM  Staffing Anesthesiologist: Lucious Debby BRAVO, MD Performed: anesthesiologist   Preanesthetic Checklist Completed: patient identified, IV checked, risks and benefits discussed, monitors and equipment checked, pre-op evaluation and timeout performed  Epidural Patient position: sitting Prep: DuraPrep Patient monitoring: continuous pulse ox and blood pressure Approach: midline Location: L3-L4 Injection technique: LOR saline  Needle:  Needle insertion depth: 4.5 cm Needle type: Tuohy  Needle gauge: 17 G Needle length: 9 cm Needle insertion depth: 4.5 cm Catheter size: 19 Gauge Catheter at skin depth: 9.5 cm Test dose: negative and Other (1% lidocaine )  Assessment Events: blood not aspirated and no cerebrospinal fluid  Additional Notes Patient identified. Risks including, but not limited to, bleeding, infection, nerve damage, paralysis, inadequate analgesia, blood pressure changes, nausea, vomiting, allergic reaction, postpartum back pain, itching, and headache were discussed. Patient expressed understanding and wished to proceed. Sterile prep and drape, including hand hygiene, mask, and sterile gloves were used. The patient was positioned and the spine was prepped. The skin was anesthetized with lidocaine . No paraesthesia or other complication noted. The patient did not experience any signs of intravascular injection such as tinnitus or metallic taste in mouth, nor signs of intrathecal spread such as rapid motor block. Please see nursing notes for vital signs. The patient tolerated the procedure well.   Debby Lucious, MDReason for block:procedure for pain

## 2023-12-09 NOTE — Lactation Note (Signed)
 This note was copied from a baby's chart. Lactation Consultation Note  Patient Name: Girl Latora Quarry Unijb'd Date: 12/09/2023 Age:27 hours Reason for consult: Initial assessment;Late-preterm 34-36.6wks.  P2, LPTI less than 5 lbs, Per MOB, infant was latching well at the breast knows to limit feedings to 10 minutes or less. MOB was set up with DEBP and understands to pump every 3 hours for 15 minutes. MOB want asssitance with infant taking the bottle, infant only consuming 2 mls or less per feeding. LC tried White Nfant nipple infant only took 2 mls and LC observed infant has uncoordinated suck and tongue thrust when using bottle nipple. LC informed RN that infant needs SLP consult. Infant was spoon feed 6 mls of formula. MOB has areola edema and nipples are tendered did not due hand expression. MOB does not have any discomfort with pumping.   Current feeding plan: LPTI 1- MOB will limit breast ( chest feeding) 10 minutes or less, MOB will continue to latching infant first every feeding, every 3 hours and will limit breast and bottle total feeding to 30 minutes or less. 2- Immediately after latching infant at the breast, MOB will supplement infant with any EBM first and then formula. Day 1 will offer ( 10-12 mls) per feeding or more if infant wants it. 3- Recommended SLP consult and MOB will continue to try and offer the White Nfant Nipple to infant.  4- MOB will continue to use the DEBP every 3 hours for 15 minutes.    Maternal Data Has patient been taught Hand Expression?: Yes Does the patient have breastfeeding experience prior to this delivery?: Yes How long did the patient breastfeed?: Per MOB, stopped 2 months due low milk supply with 1st child.  Feeding Mother's Current Feeding Choice: Breast Milk and Formula Nipple Type: Nfant Standard Flow (white)  LATCH Score  LC did not observe latch at this time. Per MOB, infant recently BF for 5 minutes.                    Lactation Tools Discussed/Used Tools: Pump Breast pump type: Double-Electric Breast Pump Pump Education: Setup, frequency, and cleaning;Milk Storage Reason for Pumping: Infant is LPTI Pumping frequency: MOB will continue to use the DEBP every 3 hours for 15 minutes.  Interventions Interventions: Breast feeding basics reviewed;Skin to skin;DEBP;Education;Pace feeding;LC Services brochure;Guidelines for Milk Supply and Pumping Schedule Handout;CDC milk storage guidelines;CDC Guidelines for Breast Pump Cleaning  Discharge Pump:  (MOB informed LC she has ordered her DEBP with her insurance company and being delivered to her home.)  Consult Status Consult Status: Follow-up Date: 12/10/23 Follow-up type: In-patient    Grayce LULLA Batter 12/09/2023, 10:34 PM

## 2023-12-09 NOTE — Anesthesia Preprocedure Evaluation (Signed)
 Anesthesia Evaluation  Patient identified by MRN, date of birth, ID band Patient awake    Reviewed: Allergy & Precautions, NPO status , Patient's Chart, lab work & pertinent test results  History of Anesthesia Complications Negative for: history of anesthetic complications  Airway Mallampati: II   Neck ROM: Full    Dental   Pulmonary former smoker   Pulmonary exam normal        Cardiovascular negative cardio ROS Normal cardiovascular exam   Hx PV stenosis as child, reportedly improved as she aged, no longer follows with cardiology    Neuro/Psych  PSYCHIATRIC DISORDERS Anxiety     negative neurological ROS     GI/Hepatic Neg liver ROS,GERD  Medicated,,  Endo/Other  negative endocrine ROS   Na 133 K 3.2 Ca 8.2   Renal/GU negative Renal ROS     Musculoskeletal negative musculoskeletal ROS (+)    Abdominal   Peds  Hematology  (+) Blood dyscrasia, anemia  Plt 179k    Anesthesia Other Findings   Reproductive/Obstetrics (+) Pregnancy                              Anesthesia Physical Anesthesia Plan  ASA: 2  Anesthesia Plan: Epidural   Post-op Pain Management: Minimal or no pain anticipated   Induction:   PONV Risk Score and Plan: 2 and Treatment may vary due to age or medical condition  Airway Management Planned: Natural Airway  Additional Equipment: None  Intra-op Plan:   Post-operative Plan:   Informed Consent: I have reviewed the patients History and Physical, chart, labs and discussed the procedure including the risks, benefits and alternatives for the proposed anesthesia with the patient or authorized representative who has indicated his/her understanding and acceptance.       Plan Discussed with: Anesthesiologist  Anesthesia Plan Comments: (Labs reviewed. Platelets acceptable, patient not taking any blood thinning medications. Per RN, FHR tracing reported to be  stable enough for sitting procedure. Risks and benefits discussed with patient, including PDPH, backache, epidural hematoma, failed epidural, blood pressure changes, allergic reaction, and nerve injury. Patient expressed understanding and wished to proceed.)        Anesthesia Quick Evaluation

## 2023-12-09 NOTE — Progress Notes (Signed)
 S: Comfortable with epidural. Consented for AROM. Partner present and supportive.   O: Vitals:   12/09/23 0846 12/09/23 0850 12/09/23 0851 12/09/23 0900  BP: (!) 122/56  124/60 (!) 117/58  Pulse: (!) 59  67 (!) 57  Resp: 18  18 18   Temp:      TempSrc:      SpO2:  100%  100%  Weight:      Height:       FHT:  FHR: 120 bpm, variability: moderate,  accelerations:  Present,  decelerations:  Absent UC:   regular, every 2-3 minutes SVE:   Dilation: 3 Effacement (%): 70 Station: -2 Exam by: A. Joshua, CNM  No blood with exam  AROM of a small amount of amber colored fluid at 0919.   A / P: Induction of labor due to suspected placental abruption and FGR 1.7%, progressing well on low dose pitocin   Fetal Wellbeing:  Category I GBS: Unknown, continue PCN per orders Pain Control:  Epidural Anticipated MOD:  NSVD  [name], CNM, MSN 12/09/2023, 9:41 AM

## 2023-12-10 LAB — CBC
HCT: 31.3 % — ABNORMAL LOW (ref 36.0–46.0)
Hemoglobin: 11 g/dL — ABNORMAL LOW (ref 12.0–15.0)
MCH: 33 pg (ref 26.0–34.0)
MCHC: 35.1 g/dL (ref 30.0–36.0)
MCV: 94 fL (ref 80.0–100.0)
Platelets: 181 K/uL (ref 150–400)
RBC: 3.33 MIL/uL — ABNORMAL LOW (ref 3.87–5.11)
RDW: 12.4 % (ref 11.5–15.5)
WBC: 13.7 K/uL — ABNORMAL HIGH (ref 4.0–10.5)
nRBC: 0 % (ref 0.0–0.2)

## 2023-12-10 NOTE — Clinical Social Work Maternal (Signed)
 CLINICAL SOCIAL WORK MATERNAL/CHILD NOTE   Patient Details  Name: Mary Ware MRN: [id number] Date of Birth: 01/12/1996   Date:  04-15-23   Clinical Social Worker Initiating Note:  Daesia Zylka      Date/Time: Initiated:  12/10/23/1144      Child's Name:  Caleb Beams Mar 14, 2023    Biological Parents:  Mother, Father Mary Ware 11/14/1996, Mary Ware 11/27/1989)    Need for Interpreter:  None    Reason for Referral:  Current Substance Use/Substance Use During Pregnancy      Address:  [address] [place] [address]    Phone number:  [phone] (home)      Additional phone number:    Household Members/Support Persons (HM/SP):   Household Member/Support Person 1, Household Member/Support Person 2     HM/SP Name Relationship DOB or Age  HM/SP -1 Elspeth Ware FOB 11/27/1989  HM/SP -2 Elspeth son 07/26/2013  HM/SP -3     HM/SP -4     HM/SP -5     HM/SP -6     HM/SP -7     HM/SP -8         Natural Supports (not living in the home):  Parent    Professional Supports: None    Employment: Unemployed    Type of Work:      Education:  Engineer, agricultural    Homebound arranged:     Surveyor, Quantity Resources:  Medicaid    Other Resources:  Sales Executive  , [employer]    Cultural/Religious Considerations Which May Impact Care:     Strengths:  Ability to meet basic needs  , Home prepared for child  , Pediatrician chosen    Psychotropic Medications:          Pediatrician:    [facility] area   Pediatrician List:    [facility]  [place]   [place]   [place] [facility]       Pediatrician Fax Number:     Risk Factors/Current Problems:  None    Cognitive State:  Able to Concentrate  , Alert      Mood/Affect:  Calm  , Comfortable      CSW Assessment: CSW received a consult for THC use during pregnancy. CSW met with MOB to complete assessment and offer support. CSW entered the room and observed  MOB resting in bed holing the infant. CSW introduced self, CSW role and reason for visit , MOB was agreeable to visit. CSW inquired about how MOB was feeling, MOB reported good. CSW confirmed MOB demographic information, MOB reported she recently moved and the address is [address] [place], [place].    CSW inquired about and MH hx, MOB denied any MH concerns. CSW provided education regarding the baby blues period vs. perinatal mood disorders, discussed treatment and gave resources for mental health follow up if concerns arise.  CSW recommends self-evaluation during the postpartum time period using the New Mom Checklist from Postpartum Progress and encouraged MOB to contact a medical professional if symptoms are noted at any time.  MOB identified FOB her dad and sister as her primary supports.    CSW inquired about MOB noted THC use, MOB reported she stopped use once she found out she was pregnant. CSW informed MOB of the hospital drug screen policy, and informed MOB infant UDS was positive for THC MOB verbalized understanding CSW informed MOB infants UDS was positive for THC and  the CDS was pending. CSW informed MOB a CPS report would be made due to infants UDS, MOB verbalized understanding. CSW made CPS report to [facility] due to infants UDS results, no barriers to discharge.    CSW provided review of Sudden Infant Death Syndrome (SIDS) precautions.  MOB identified [facility] for infants follow up care. MOB reported she has all essential items for the infant including a bassinet and car seat. CSW identifies no further need for intervention and no barriers to discharge at this time.   CSW Plan/Description:  No Further Intervention Required/No Barriers to Discharge, Sudden Infant Death Syndrome (SIDS) Education, Perinatal Mood and Anxiety Disorder (PMADs) Education, CSW Will Continue to Monitor Umbilical Cord Tissue Drug Screen Results and Make Report if [facility], [facility] Drug Screen Policy  Information, Child Protective Service Report        Eliazar CHRISTELLA Gave, LCSW 09-09-2023, 11:49 AM

## 2023-12-10 NOTE — Plan of Care (Signed)
  Problem: Education: Goal: Knowledge of General Education information will improve Description: Including pain rating scale, medication(s)/side effects and non-pharmacologic comfort measures Outcome: Progressing   Problem: Health Behavior/Discharge Planning: Goal: Ability to manage health-related needs will improve Outcome: Progressing   Problem: Clinical Measurements: Goal: Ability to maintain clinical measurements within normal limits will improve Outcome: Progressing Goal: Will remain free from infection Outcome: Progressing Goal: Diagnostic test results will improve Outcome: Progressing Goal: Respiratory complications will improve Outcome: Progressing Goal: Cardiovascular complication will be avoided Outcome: Progressing   Problem: Activity: Goal: Risk for activity intolerance will decrease Outcome: Progressing   Problem: Nutrition: Goal: Adequate nutrition will be maintained Outcome: Progressing   Problem: Coping: Goal: Level of anxiety will decrease Outcome: Progressing   Problem: Elimination: Goal: Will not experience complications related to bowel motility Outcome: Progressing Goal: Will not experience complications related to urinary retention Outcome: Progressing   Problem: Pain Managment: Goal: General experience of comfort will improve and/or be controlled Outcome: Progressing   Problem: Safety: Goal: Ability to remain free from injury will improve Outcome: Progressing   Problem: Skin Integrity: Goal: Risk for impaired skin integrity will decrease Outcome: Progressing   Problem: Education: Goal: Knowledge of condition will improve Outcome: Progressing   Problem: Activity: Goal: Will verbalize the importance of balancing activity with adequate rest periods Outcome: Progressing Goal: Ability to tolerate increased activity will improve Outcome: Progressing   Problem: Coping: Goal: Ability to identify and utilize available resources and services  will improve Outcome: Progressing   Problem: Life Cycle: Goal: Chance of risk for complications during the postpartum period will decrease Outcome: Progressing   Problem: Role Relationship: Goal: Ability to demonstrate positive interaction with newborn will improve Outcome: Progressing   Problem: Skin Integrity: Goal: Demonstration of wound healing without infection will improve Outcome: Progressing

## 2023-12-10 NOTE — Anesthesia Postprocedure Evaluation (Signed)
 Anesthesia Post Note  Patient: Mary Ware  Procedure(s) Performed: AN AD HOC LABOR EPIDURAL     Patient location during evaluation: Mother Baby Anesthesia Type: Epidural Level of consciousness: awake and alert Pain management: pain level controlled Vital Signs Assessment: post-procedure vital signs reviewed and stable Respiratory status: spontaneous breathing, nonlabored ventilation and respiratory function stable Cardiovascular status: stable Postop Assessment: no headache, no backache and epidural receding Anesthetic complications: no   No notable events documented.  Last Vitals:  Vitals:   12/10/23 0000 12/10/23 0610  BP: 112/64 106/61  Pulse: (!) 55 60  Resp: 16 16  Temp: 36.8 C 36.7 C  SpO2: 100% 99%    Last Pain:  Vitals:   12/10/23 0811  TempSrc:   PainSc: 0-No pain   Pain Goal: Patients Stated Pain Goal: 0 (12/08/23 2104)                 CHERILYN SLOUGH

## 2023-12-10 NOTE — Progress Notes (Signed)
 PPD# 1 SVD w/ intact perineum Information for the patient's newborn:  Mary Ware, Mary Ware [[id number]]  female   S:   Reports feeling good Tolerating PO fluid and solids No nausea or vomiting Bleeding is light, no clots Pain controlled with acetaminophen  and ibuprofen  (OTC) Up ad lib / ambulatory / voiding w/o difficulty Feeding: Bottle and formula    O:   VS: BP 113/76 (BP Location: Left Arm)   Pulse (!) 53   Temp 98.1 F (36.7 C) (Oral)   Resp 16   Ht 5' (1.524 m)   Wt 57.2 kg   LMP 03/29/2023   SpO2 99%   Breastfeeding Unknown   BMI 24.61 kg/m   LABS:  Recent Labs    12/09/23 0340 12/10/23 0425  WBC 13.0* 13.7*  HGB 11.3* 11.0*  PLT 179 181   Blood type: --/--/A POS Performed at [facility], [address], [place], [place] [address]  [phone]) Rubella: Immune (05/23 0000)                      I&O: Intake/Output      11/04 0701 11/05 0700 11/05 0701 11/06 0700   I.V. (mL/kg) 1934 (33.8)    Other 91.4    IV Piggyback 250    Total Intake(mL/kg) 2275.4 (39.8)    Urine (mL/kg/hr) 483 (0.4)    Blood 193    Total Output 676    Net +1599.4         Urine Occurrence 1 x     Physical Exam: Alert and oriented X3 Lungs: Clear and unlabored Heart: regular rate and rhythm / no mumurs Abdomen: soft, non-tender, non-distended  Fundus: firm, non-tender, U-2 Perineum: intact Lochia: appropriate Extremities: no edema, negative for calf pain, tenderness, or cords   A:  PPD # 1  Normal exam S/P abruption    -minimal blood loss and minimal decline in Hgb/Hct Hx of substance abuse    -pt positive for cannabinoid on admission    -infant was also cannabinoid positive    -CSW consult completed, no further needs for intervention and no barriers to discharge  P:  Routine postpartum orders Contraception: did not ask Anticipate D/C on PP day 2 or 3 depending on infant status Plan reviewed w/ Dr. Armond Mercer Mary Arnell, DNP, CNM 12/10/2023, 2:55  PM

## 2023-12-11 MED ORDER — IBUPROFEN 600 MG PO TABS: 600.0000 mg | ORAL_TABLET | Freq: Four times a day (QID) | ORAL | 0 refills | Status: AC

## 2023-12-11 MED ORDER — ACETAMINOPHEN 325 MG PO TABS: 650.0000 mg | ORAL_TABLET | ORAL | Status: AC | PRN

## 2023-12-11 NOTE — Discharge Summary (Addendum)
 Postpartum Discharge Summary  Date of Service updated 12/11/23    Patient Name: Mary Ware DOB: 07-06-1995 MRN: [id number]  Date of admission: 12/08/2023 Delivery date:12/09/2023 Delivering provider: JOSHUA PALMA K Date of discharge: 12/11/2023  Admitting diagnosis: Normal labor [O80, Z37.9] Intrauterine pregnancy: [redacted]w[redacted]d     Secondary diagnosis:  Principal Problem:   Postpartum care following vaginal delivery 11/4 Active Problems:   IUGR (intrauterine growth restriction) affecting care of mother   Normal labor   Placental abruption   SVD (spontaneous vaginal delivery)  Additional problems: Hx of substance abuse - positive for cannabinoid on admission    Discharge diagnosis: Preterm Pregnancy Delivered                                              Post partum procedures:none Augmentation: AROM and Pitocin  Complications: Placental Abruption  Hospital course: Onset of Labor With Vaginal Delivery      28 y.o. yo [id number] at [redacted]w[redacted]d was admitted in Latent Labor on 12/08/2023. Labor course was complicated byvaginal bleeding  Membrane Rupture Time/Date: 9:19 AM,12/09/2023  Delivery Method:Vaginal, Spontaneous Operative Delivery:N/A Episiotomy: None Lacerations:  None Patient had a postpartum course was uncomplicated. Social work consult was completed for history of maternal substance abuse, THC present on admission, THC positive for newborn. There were no barriers to discharge identified at the completion of the consult. She is ambulating, tolerating a regular diet, passing flatus, and urinating well. Pt reports a hx of severe depression 4 years ago and was managed on Seroquel. Pt has not used medication in 3+_ years and reports feeling stable. Pt agrees to follow-up in 2 weeks for mood evaluation and aware of reportable symptoms. Patient is discharged home in stable condition on 12/11/23.  Newborn Data: Birth date:12/09/2023 Birth time:1:22 PM Gender:Female Living  status:Living Apgars:9 ,9  Weight:2130 g  Magnesium  Sulfate received: No BMZ received: No Rhophylac:N/A MMR:N/A Transfusion:No Immunizations administered: Immunization History  Administered Date(s) Administered   Influenza,inj,Quad PF,6+ Mos 04/28/2015   Tdap 07/28/2013    Physical exam  Vitals:   12/10/23 0610 12/10/23 1435 12/10/23 1950 12/11/23 0512  BP: 106/61 113/76 117/66 105/70  Pulse: 60 (!) 53 63 (!) 56  Resp: 16 16 18 17   Temp: 98 F (36.7 C) 98.1 F (36.7 C) 98.8 F (37.1 C)   TempSrc: Oral Oral Oral Oral  SpO2: 99%  100% 99%  Weight:      Height:       General: alert, cooperative, and no distress Lochia: appropriate Uterine Fundus: firm Perineum: intact DVT Evaluation: No evidence of DVT seen on physical exam. No cords or calf tenderness. No significant calf/ankle edema. Labs: Lab Results  Component Value Date   WBC 13.7 (H) 12/10/2023   HGB 11.0 (L) 12/10/2023   HCT 31.3 (L) 12/10/2023   MCV 94.0 12/10/2023   PLT 181 12/10/2023      Latest Ref Rng & Units 12/08/2023    9:47 PM  CMP  Glucose 70 - 99 mg/dL 85   BUN 6 - 20 mg/dL 5   Creatinine 9.55 - 8.99 mg/dL 9.54   Sodium 864 - 854 mmol/L 133   Potassium 3.5 - 5.1 mmol/L 3.2   Chloride 98 - 111 mmol/L 101   CO2 22 - 32 mmol/L 22   Calcium 8.9 - 10.3 mg/dL 8.2   Total Protein 6.5 - 8.1  g/dL 5.7   Total Bilirubin 0.0 - 1.2 mg/dL 0.4   Alkaline Phos 38 - 126 U/L 408   AST 15 - 41 U/L 34   ALT 0 - 44 U/L 27    Edinburgh Score:    12/10/2023    3:59 PM  Edinburgh Postnatal Depression Scale Screening Tool  I have been able to laugh and see the funny side of things. 0  I have looked forward with enjoyment to things. 0  I have blamed myself unnecessarily when things went wrong. 0  I have been anxious or worried for no good reason. 0  I have felt scared or panicky for no good reason. 0  Things have been getting on top of me. 0  I have been so unhappy that I have had difficulty sleeping. 0   I have felt sad or miserable. 0  I have been so unhappy that I have been crying. 0  The thought of harming myself has occurred to me. 0  Edinburgh Postnatal Depression Scale Total 0      After visit meds:  Allergies as of 12/11/2023       Reactions   Coconut Fatty Acid Anaphylaxis        Medication List     STOP taking these medications    omeprazole 20 MG capsule Commonly known as: PRILOSEC   ondansetron  4 MG tablet Commonly known as: ZOFRAN        TAKE these medications    acetaminophen  325 MG tablet Commonly known as: Tylenol  Take 2 tablets (650 mg total) by mouth every 4 (four) hours as needed (for pain scale < 4).   ibuprofen  600 MG tablet Commonly known as: ADVIL  Take 1 tablet (600 mg total) by mouth every 6 (six) hours.   PRENATAL VITAMINS PO Take 1 tablet by mouth daily.   VITAMIN D PO Take by mouth.         Discharge home in stable condition Infant Feeding: Breast and formula for low birth weight and weight loss of *% on day 2 Infant Disposition:rooming in Discharge instruction: per After Visit Summary and Postpartum booklet. Activity: Advance as tolerated. Pelvic rest for 6 weeks.  Diet: routine diet Anticipated Birth Control: Nexplanon  Postpartum Appointment:6 weeks Additional Postpartum F/U: Postpartum Depression checkup in 2 weeks.  Future Appointments:No future appointments. Follow up Visit:  Follow-up Information     [facility] Follow up in 2 week(s).   Specialty: Obstetrics and Gynecology Why: Return to [facility] in 2 weeks for mood evaluation and then again in 6 weeks for your postpartum appointment. Contact information: [address] [place]  [address] [phone]                    12/11/2023 Mercer KATHEE Peal, CNM

## 2023-12-11 NOTE — Discharge Instructions (Signed)

## 2023-12-11 NOTE — Plan of Care (Signed)
  Problem: Education: Goal: Knowledge of General Education information will improve Description: Including pain rating scale, medication(s)/side effects and non-pharmacologic comfort measures Outcome: Adequate for Discharge   Problem: Health Behavior/Discharge Planning: Goal: Ability to manage health-related needs will improve Outcome: Adequate for Discharge   Problem: Clinical Measurements: Goal: Ability to maintain clinical measurements within normal limits will improve Outcome: Adequate for Discharge Goal: Will remain free from infection Outcome: Adequate for Discharge Goal: Diagnostic test results will improve Outcome: Adequate for Discharge Goal: Respiratory complications will improve Outcome: Adequate for Discharge Goal: Cardiovascular complication will be avoided Outcome: Adequate for Discharge   Problem: Activity: Goal: Risk for activity intolerance will decrease Outcome: Adequate for Discharge   Problem: Nutrition: Goal: Adequate nutrition will be maintained Outcome: Adequate for Discharge   Problem: Coping: Goal: Level of anxiety will decrease Outcome: Adequate for Discharge   Problem: Elimination: Goal: Will not experience complications related to bowel motility Outcome: Adequate for Discharge Goal: Will not experience complications related to urinary retention Outcome: Adequate for Discharge   Problem: Pain Managment: Goal: General experience of comfort will improve and/or be controlled Outcome: Adequate for Discharge   Problem: Safety: Goal: Ability to remain free from injury will improve Outcome: Adequate for Discharge   Problem: Skin Integrity: Goal: Risk for impaired skin integrity will decrease Outcome: Adequate for Discharge   Problem: Education: Goal: Knowledge of condition will improve Outcome: Adequate for Discharge   Problem: Activity: Goal: Will verbalize the importance of balancing activity with adequate rest periods Outcome: Adequate  for Discharge Goal: Ability to tolerate increased activity will improve Outcome: Adequate for Discharge   Problem: Coping: Goal: Ability to identify and utilize available resources and services will improve Outcome: Adequate for Discharge   Problem: Life Cycle: Goal: Chance of risk for complications during the postpartum period will decrease Outcome: Adequate for Discharge   Problem: Role Relationship: Goal: Ability to demonstrate positive interaction with newborn will improve Outcome: Adequate for Discharge   Problem: Skin Integrity: Goal: Demonstration of wound healing without infection will improve Outcome: Adequate for Discharge

## 2023-12-11 NOTE — Lactation Note (Signed)
 This note was copied from a baby's chart. Lactation Consultation Note  Patient Name: Mary Ware Unijb'd Date: 12/11/2023 Age:27 hours Reason for consult: Follow-up assessment;Late-preterm 34-36.6wks;Infant < 5lbs  P2- Infant was born at 36w GA and weighed less than 5 lbs at birth. Infant now has a 8% weight loss. MOB reports that infant's feedings have been better today compared to yesterday, but per infant's feeding chart her feedings have not been sufficient enough. MOB had just finished giving infant 10 mL of formula. LC asked if I could finish the feeding to see how infant is doing. MOB was receptive. LC placed infant in a side lying pace feed position. Infant was cueing like she wanted to suck, but created no suction when trying to feed. Infant tired out after 5 minutes of feeding with LC. Infant drank a total of 2 mL with LC, but some spilled out of the side of her mouth. LC encouraged MOB to not offer the breast with this feeding and to pump for 15 minutes. LC informed the MD and SLP of LC's concerns and how poor infant's feedings have been going. LC encouraged MOB to call for further assistance as needed.  Maternal Data Has patient been taught Hand Expression?: No Does the patient have breastfeeding experience prior to this delivery?: Yes How long did the patient breastfeed?: 1 month then milk dried up  Feeding Mother's Current Feeding Choice: Breast Milk and Formula Nipple Type: Nfant Slow Flow (purple)  Lactation Tools Discussed/Used Tools: Pump;Flanges Breast pump type: Double-Electric Breast Pump;Manual Pump Education: Setup, frequency, and cleaning;Milk Storage Reason for Pumping: LPI/low birth weight infant Pumping frequency: 15-20 min every 3 hrs  Interventions Interventions: Breast feeding basics reviewed;DEBP;Education;Pace feeding;LC Services brochure;LPT handout/interventions  Discharge Discharge Education: Engorgement and breast care;Warning signs for  feeding baby Pump: DEBP;Personal  Consult Status Consult Status: Follow-up Date: 12/12/23 Follow-up type: In-patient    Recardo Hoit BS, IBCLC 12/11/2023, 10:52 AM

## 2023-12-12 LAB — BPAM RBC
Blood Product Expiration Date: 202511242359
Blood Product Unit Number: 202511242359
ISSUE DATE / TIME: 202511040807
PRODUCT CODE: 202511040609
PRODUCT CODE: 202511242359
PRODUCT CODE: 202511242359
PRODUCT CODE: 202511242359
PRODUCT CODE: 202511242359
PRODUCT CODE: 202511242359
Unit Type and Rh: 202511242359
Unit Type and Rh: 6200
Unit Type and Rh: 6200
Unit Type and Rh: 6200
Unit Type and Rh: 6200
Unit Type and Rh: 6200
Unit Type and Rh: 6200
Unit Type and Rh: 6200

## 2023-12-12 LAB — TYPE AND SCREEN
ABO/RH(D): A POS
Antibody Screen: NEGATIVE
Unit division: 0
Unit division: 0
Unit division: 0
Unit division: 0

## 2023-12-12 LAB — SURGICAL PATHOLOGY

## 2023-12-13 NOTE — Lactation Note (Signed)
 This note was copied from a Mary's chart.  NICU Lactation Consultation Note  Patient Name: Mary Ware Date: 12/13/2023 Age:27 days  Reason for consult: Follow-up assessment; NICU Mary; Late-preterm 34-36.6wks; Infant < 5lbs; Other (Comment) (IUGR, SGA, Transfer from [facility], Mary's cord THC (+))  SUBJECTIVE Visited with family of 73 5/42 weeks old AGA NICU female; Mary Ware got admitted due to being SGA. Ms. Woodhead is pumping, her milk is already in, praised her for all her efforts (see maternal assessment). Her plan is to do both, direct breastfeeding along with pumping and bottle feeding with breastmilk/formula. Noticed that pumping hasn't been consistent. Explained the importance of consistent pumping for the prevention of engorgement and to protect her supply.   Reviewed NICU pumping log, pump settings, lactogenesis III and anticipatory guidelines. She got her insurance pump and she' been using it at home and in Mary's room. She took all her pump pieces from the [employer] pump to home; asked her to bring them in so she can use the hospital grade pump when visiting Mary Ware; she prefers her personal pump though.   OBJECTIVE Infant data: Mother's Current Feeding Choice: Breast Milk and Formula  O2 Device: Room Air  Infant feeding assessment IDFTS - Readiness: 2 IDFTS - Quality: 3   Maternal data: [id number] Vaginal, Spontaneous Significant Breast History:: (+) breast changes during the pregnancy Current breast feeding challenges:: NICU admission Previous breastfeeding challenges?: Lack of support (she had her first Mary when she was 28 y.o) Pumping frequency: 3-4 times/24 hours Pumped volume: 25 mL Risk factor for low/delayed milk supply:: IUGR, < 5lbs, infant separation  Pump: Hands Free, Personal (Spectra  S9 plus (through insurance))  ASSESSMENT Infant: Feeding Status: Scheduled 9-12-3-6 Feeding method: Bottle; Tube/Gavage (Bolus) Nipple Type: Dr. Jonna Fling Preemie  Maternal: Milk volume: Normal No S/S of engorgement at this time  INTERVENTIONS/PLAN Interventions: Interventions: Breast feeding basics reviewed; DEBP; Education; NICU Pumping Log Tools: Pump; Other (comment) (olive oil due to coconut allergy)  Plan: STS around care times Pump both breasts on maintain mode every 3 hours for 15-30 minutes, ideally 8 pumping sessions/24 hours Offer the breast on feeding cues around feeding times and call for assistance PRN Parents will continue advancing on bottle feedings  FOB present but asleep. All questions and concerns answered, family to contact [facility] services PRN.  Consult Status: NICU follow-up NICU Follow-up type: Weekly NICU follow up   Dakotah Orrego GORMAN Crate 12/13/2023, 6:53 PM

## 2023-12-25 ENCOUNTER — Telehealth (HOSPITAL_COMMUNITY): Payer: Self-pay | Admitting: *Deleted

## 2023-12-25 NOTE — Telephone Encounter (Signed)
 12/25/2023  Name: Mary Ware MRN: [id number] DOB: 01-17-1996  Reason for Call:  Transition of Care Hospital Discharge Call  Contact Status: Patient Contact Status: Message  Language assistant needed:          Follow-Up Questions:    Van Postnatal Depression Scale:  In the Past 7 Days:    PHQ2-9 Depression Scale:     Discharge Follow-up:    Post-discharge interventions: NA  Mliss Sieve, RN 12/25/2023 11:32

## 2023-12-26 ENCOUNTER — Telehealth (HOSPITAL_COMMUNITY): Payer: Self-pay | Admitting: *Deleted

## 2023-12-26 NOTE — Telephone Encounter (Signed)
 12/26/2023  Name: Mary Ware MRN: [id number] DOB: Jun 11, 1995  Reason for Call:  Transition of Care Hospital Discharge Call  Contact Status: Patient Contact Status: Complete  Language assistant needed: Interpreter Mode: Interpreter Not Needed        Follow-Up Questions: Do You Have Any Concerns About Your Health As You Heal From Delivery?: No Do You Have Any Concerns About Your Infants Health?: No  Edinburgh Postnatal Depression Scale:  In the Past 7 Days:    PHQ2-9 Depression Scale:     Discharge Follow-up: Edinburgh score requires follow up?:  (Patient says answers are the same as in the hospital when score was 0, endorses she is doing well emotionally.) Patient was advised of the following resources:: Support Group, Breastfeeding Support Group  Post-discharge interventions: Reviewed Newborn Safe Sleep Practices  Mliss Sieve, RN 12/26/2023 12:26

## 2024-01-04 ENCOUNTER — Inpatient Hospital Stay (HOSPITAL_COMMUNITY): Admit: 2024-01-04

## 2024-01-05 NOTE — Lactation Note (Signed)
 This note was copied from a baby's chart.  NICU Lactation Consultation Note  Patient Name: [name] Date: 01/05/2024 Age:27 wk.o.  Reason for consult: Weekly NICU follow-up; NICU baby; Infant < 6lbs; Other (Comment); Term; Exclusive pumping and bottle feeding (IUGR, SGA, Transfer from [facility], baby's cord THC (+))  SUBJECTIVE Visited with family of 55 27/68 weeks old AGA NICU female Mary Ware; Mary Ware is a P2 and reported she's still pumping but has slowed down. Noticed last breastmilk feeding on flowsheet was on 12/29/2023. She voiced she's been freezing it at home and would like to continue pumping after baby's discharge. Parents are expecting to take [facility] home possibly tomorrow, there is already discharge planning underway. Re-educated about the importance of consistent and frequent pumping to protect her supply and revised strategies to increase it such as STS care, power pumping and pumping whenever Mary Ware is getting a bottle. She politely declined a referral to [facility] OP F/U as her plan is to exclusively pump and bottle feed with breastmilk/formula but has their contact information in case she needs to reach out. FOB and big brother present. All questions and concerns answered, family to contact [facility] services PRN.  OBJECTIVE Infant data: Mother's Current Feeding Choice: Breast Milk and Formula  O2 Device: Room Air  Infant feeding assessment IDFTS - Readiness: 1 IDFTS - Quality: 2   Maternal data: [id number] Vaginal, Spontaneous Pumping frequency: 3 times/24 hours Pumped volume: 30 mL  Pump: Hands Free, Personal (Spectra  S9 plus (through insurance))  ASSESSMENT Infant: Feeding Status: Ad lib Feeding method: Bottle Nipple Type: Dr. Jonna Preemie (Wide-base)  Maternal: Milk volume: Low  INTERVENTIONS/PLAN Interventions: Interventions: Breast feeding basics reviewed; DEBP; Education Discharge Education: Outpatient recommendation  Plan: Consult Status:  Complete   Mary Ware 01/05/2024, 5:57 PM
